# Patient Record
Sex: Male | Born: 1955 | Race: White | Hispanic: No | Marital: Married | Smoking: Never smoker
Health system: Southern US, Community
[De-identification: ages and names within clinical notes are randomized; demographics above are authoritative.]

## PROBLEM LIST (undated history)

## (undated) DIAGNOSIS — M1990 Unspecified osteoarthritis, unspecified site: Secondary | ICD-10-CM

## (undated) DIAGNOSIS — M4800 Spinal stenosis, site unspecified: Secondary | ICD-10-CM

## (undated) DIAGNOSIS — I10 Essential (primary) hypertension: Secondary | ICD-10-CM

## (undated) DIAGNOSIS — E669 Obesity, unspecified: Secondary | ICD-10-CM

## (undated) DIAGNOSIS — K429 Umbilical hernia without obstruction or gangrene: Secondary | ICD-10-CM

## (undated) DIAGNOSIS — G4730 Sleep apnea, unspecified: Secondary | ICD-10-CM

## (undated) HISTORY — PX: BACK SURGERY: SHX140

## (undated) HISTORY — PX: HERNIA REPAIR: SHX51

## (undated) HISTORY — PX: VASECTOMY: SHX75

---

## 1998-09-13 ENCOUNTER — Ambulatory Visit (HOSPITAL_COMMUNITY): Admission: RE | Admit: 1998-09-13 | Discharge: 1998-09-13 | Payer: Self-pay | Admitting: Family Medicine

## 1998-09-13 ENCOUNTER — Encounter: Payer: Self-pay | Admitting: Family Medicine

## 1998-09-29 ENCOUNTER — Ambulatory Visit: Admission: RE | Admit: 1998-09-29 | Discharge: 1998-09-29 | Payer: Self-pay | Admitting: Family Medicine

## 1998-10-31 ENCOUNTER — Ambulatory Visit: Admission: RE | Admit: 1998-10-31 | Discharge: 1998-10-31 | Payer: Self-pay | Admitting: Family Medicine

## 1998-12-05 ENCOUNTER — Encounter: Admission: RE | Admit: 1998-12-05 | Discharge: 1999-03-05 | Payer: Self-pay | Admitting: Family Medicine

## 2007-04-06 ENCOUNTER — Ambulatory Visit (HOSPITAL_COMMUNITY): Admission: RE | Admit: 2007-04-06 | Discharge: 2007-04-07 | Payer: Self-pay | Admitting: Neurological Surgery

## 2013-08-29 ENCOUNTER — Other Ambulatory Visit: Payer: Self-pay | Admitting: Family Medicine

## 2013-08-29 ENCOUNTER — Ambulatory Visit
Admission: RE | Admit: 2013-08-29 | Discharge: 2013-08-29 | Disposition: A | Discharge status: Home / Self Care | Payer: Managed Care, Other (non HMO) | Source: Ambulatory Visit | Attending: Family Medicine | Admitting: Family Medicine

## 2013-08-29 DIAGNOSIS — M25561 Pain in right knee: Secondary | ICD-10-CM

## 2014-03-27 ENCOUNTER — Other Ambulatory Visit: Payer: Self-pay | Admitting: Orthopedic Surgery

## 2014-04-02 ENCOUNTER — Encounter (HOSPITAL_COMMUNITY): Payer: Self-pay | Admitting: Pharmacy Technician

## 2014-04-05 NOTE — Pre-Procedure Instructions (Signed)
Anice PaganiniDavid A Seehafer  04/05/2014   Your procedure is scheduled on:  Monday, April 27th  Report to Admitting at 0530 AM.  Call this number if you have problems the morning of surgery: [phone]   Remember:   Do not eat food or drink liquids after midnight.   Take these medicines the morning of surgery with A SIP OF WATER: pain medication if needed   Do not wear jewelry.  Do not wear lotions, powders, or perfumes. You may wear deodorant.  Do not shave 48 hours prior to surgery. Men may shave face and neck.  Do not bring valuables to the hospital.  [facility] is not responsible for any belongings or valuables.               Contacts, dentures or bridgework may not be worn into surgery.  Leave suitcase in the car. After surgery it may be brought to your room.  For patients admitted to the hospital, discharge time is determined by your  treatment team.  Please read over the following fact sheets that you were given: Pain Booklet, Coughing and Deep Breathing, Blood Transfusion Information, MRSA Information and Surgical Site Infection Prevention [facility] - Preparing for Surgery  Before surgery, you can play an important role.  Because skin is not sterile, your skin needs to be as free of germs as possible.  You can reduce the number of germs on you skin by washing with CHG (chlorahexidine gluconate) soap before surgery.  CHG is an antiseptic cleaner which kills germs and bonds with the skin to continue killing germs even after washing.  Please DO NOT use if you have an allergy to CHG or antibacterial soaps.  If your skin becomes reddened/irritated stop using the CHG and inform your nurse when you arrive at Short Stay.  Do not shave (including legs and underarms) for at least 48 hours prior to the first CHG shower.  You may shave your face.  Please follow these instructions carefully:   1.  Shower with CHG Soap the night before surgery and the morning of Surgery.  2.  If you choose to wash your  hair, wash your hair first as usual with your normal shampoo.  3.  After you shampoo, rinse your hair and body thoroughly to remove the shampoo.  4.  Use CHG as you would any other liquid soap.  You can apply CHG directly to the skin and wash gently with scrungie or a clean washcloth.  5.  Apply the CHG Soap to your body ONLY FROM THE NECK DOWN.  Do not use on open wounds or open sores.  Avoid contact with your eyes, ears, mouth and genitals (private parts).  Wash genitals (private parts) with your normal soap.  6.  Wash thoroughly, paying special attention to the area where your surgery will be performed.  7.  Thoroughly rinse your body with warm water from the neck down.  8.  DO NOT shower/wash with your normal soap after using and rinsing off the CHG Soap.  9.  Pat yourself dry with a clean towel.            10.  Wear clean pajamas.            11.  Place clean sheets on your bed the night of your first shower and do not sleep with pets.  Day of Surgery  Do not apply any lotions/deoderants the morning of surgery.  Please wear clean clothes to the hospital/surgery  center.

## 2014-04-06 ENCOUNTER — Encounter (HOSPITAL_COMMUNITY)
Admission: RE | Admit: 2014-04-06 | Discharge: 2014-04-06 | Disposition: A | Discharge status: Home / Self Care | Payer: BC Managed Care – PPO | Source: Ambulatory Visit | Attending: Orthopedic Surgery | Admitting: Orthopedic Surgery

## 2014-04-06 ENCOUNTER — Ambulatory Visit (HOSPITAL_COMMUNITY)
Admission: RE | Admit: 2014-04-06 | Discharge: 2014-04-06 | Disposition: A | Discharge status: Home / Self Care | Payer: BC Managed Care – PPO | Source: Ambulatory Visit | Attending: Orthopedic Surgery | Admitting: Orthopedic Surgery

## 2014-04-06 ENCOUNTER — Encounter (HOSPITAL_COMMUNITY): Payer: Self-pay

## 2014-04-06 DIAGNOSIS — Z01818 Encounter for other preprocedural examination: Secondary | ICD-10-CM | POA: Insufficient documentation

## 2014-04-06 DIAGNOSIS — Z01812 Encounter for preprocedural laboratory examination: Secondary | ICD-10-CM | POA: Insufficient documentation

## 2014-04-06 DIAGNOSIS — Z01810 Encounter for preprocedural cardiovascular examination: Secondary | ICD-10-CM | POA: Insufficient documentation

## 2014-04-06 HISTORY — DX: Essential (primary) hypertension: I10

## 2014-04-06 HISTORY — DX: Sleep apnea, unspecified: G47.30

## 2014-04-06 HISTORY — DX: Unspecified osteoarthritis, unspecified site: M19.90

## 2014-04-06 HISTORY — DX: Obesity, unspecified: E66.9

## 2014-04-06 LAB — CBC WITH DIFFERENTIAL/PLATELET
Basophils Absolute: 0 10*3/uL (ref 0.0–0.1)
Basophils Relative: 1 % (ref 0–1)
Eosinophils Absolute: 0.4 10*3/uL (ref 0.0–0.7)
Eosinophils Relative: 4 % (ref 0–5)
HCT: 44.4 % (ref 39.0–52.0)
HEMOGLOBIN: 15.9 g/dL (ref 13.0–17.0)
LYMPHS ABS: 2.1 10*3/uL (ref 0.7–4.0)
Lymphocytes Relative: 25 % (ref 12–46)
MCH: 31.9 pg (ref 26.0–34.0)
MCHC: 35.8 g/dL (ref 30.0–36.0)
MCV: 89.2 fL (ref 78.0–100.0)
MONOS PCT: 8 % (ref 3–12)
Monocytes Absolute: 0.6 10*3/uL (ref 0.1–1.0)
Neutro Abs: 5.1 10*3/uL (ref 1.7–7.7)
Neutrophils Relative %: 62 % (ref 43–77)
Platelets: 263 10*3/uL (ref 150–400)
RBC: 4.98 MIL/uL (ref 4.22–5.81)
RDW: 13.6 % (ref 11.5–15.5)
WBC: 8.2 10*3/uL (ref 4.0–10.5)

## 2014-04-06 LAB — URINALYSIS, ROUTINE W REFLEX MICROSCOPIC
Bilirubin Urine: NEGATIVE
Glucose, UA: NEGATIVE mg/dL
Hgb urine dipstick: NEGATIVE
Ketones, ur: NEGATIVE mg/dL
LEUKOCYTES UA: NEGATIVE
Nitrite: NEGATIVE
Protein, ur: NEGATIVE mg/dL
SPECIFIC GRAVITY, URINE: 1.019 (ref 1.005–1.030)
UROBILINOGEN UA: 0.2 mg/dL (ref 0.0–1.0)
pH: 5 (ref 5.0–8.0)

## 2014-04-06 LAB — BASIC METABOLIC PANEL
BUN: 22 mg/dL (ref 6–23)
CALCIUM: 9.9 mg/dL (ref 8.4–10.5)
CO2: 24 meq/L (ref 19–32)
Chloride: 102 mEq/L (ref 96–112)
Creatinine, Ser: 1.1 mg/dL (ref 0.50–1.35)
GFR calc Af Amer: 85 mL/min — ABNORMAL LOW (ref >90)
GFR, EST NON AFRICAN AMERICAN: 73 mL/min — AB (ref >90)
GLUCOSE: 106 mg/dL — AB (ref 70–99)
POTASSIUM: 4.7 meq/L (ref 3.7–5.3)
Sodium: 140 mEq/L (ref 137–147)

## 2014-04-06 LAB — PREPARE RBC (CROSSMATCH)

## 2014-04-06 LAB — APTT: APTT: 28 s (ref 24–37)

## 2014-04-06 LAB — ABO/RH: ABO/RH(D): A POS

## 2014-04-06 LAB — PROTIME-INR
INR: 0.96 (ref 0.00–1.49)
PROTHROMBIN TIME: 12.6 s (ref 11.6–15.2)

## 2014-04-06 LAB — SURGICAL PCR SCREEN
MRSA, PCR: NEGATIVE
Staphylococcus aureus: NEGATIVE

## 2014-04-12 NOTE — H&P (Signed)
TOTAL KNEE ADMISSION H&P  Patient is being admitted for bilaterally total knee arthroplasty.  Subjective:  Chief Complaint:bilaterally knee pain.  HPI: Derrick PaganiniDavid A Pawelski, 58 y.o. male, has a history of pain and functional disability in the bilaterally knee due to arthritis and has failed non-surgical conservative treatments for greater than 12 weeks to includeNSAID's and/or analgesics, corticosteriod injections, flexibility and strengthening excercises and activity modification.  Onset of symptoms was gradual, starting 10 years ago with gradually worsening course since that time. The patient noted no past surgery on the bilaterally knee(s).  Patient currently rates pain in the bilaterally knee(s) at 10 out of 10 with activity. Patient has night pain, worsening of pain with activity and weight bearing, pain that interferes with activities of daily living and pain with passive range of motion.  Patient has evidence of joint subluxation and joint space narrowing by imaging studies.  There is no active infection.  There are no active problems to display for this patient.  Past Medical History  Diagnosis Date  . Hypertension   . Arthritis   . Sleep apnea     cpap       last study 99  . Obese     Past Surgical History  Procedure Laterality Date  . Back surgery    . Hernia repair      No prescriptions prior to admission   No Known Allergies  History  Substance Use Topics  . Smoking status: Never Smoker   . Smokeless tobacco: Not on file  . Alcohol Use: Yes     Comment: occ    No family history on file.   Review of Systems  Constitutional: Negative.   HENT: Negative.   Eyes:       Glasses  Respiratory: Negative.   Cardiovascular: Negative.   Gastrointestinal: Negative.   Genitourinary: Negative.   Musculoskeletal: Positive for joint pain.  Skin: Negative.   Neurological: Negative.   Psychiatric/Behavioral: Negative.     Objective:  Physical Exam  Constitutional: He is  oriented to person, place, and time. He appears well-developed and well-nourished.  HENT:  Head: Normocephalic and atraumatic.  Eyes: Pupils are equal, round, and reactive to light.  Neck: Normal range of motion. Neck supple.  Cardiovascular: Intact distal pulses.   Respiratory: Effort normal.  Musculoskeletal: He exhibits tenderness.  The patient has bilateral 5 flexion contractures, flexes to 120 with pain and there is crepitus as you taken through range of motion.  He is quite tender along the medial joint lines trace effusion to the left knee, no effusion to the right knee he has obvious varus deformities when he is walking.  Neurological: He is alert and oriented to person, place, and time.  Skin: Skin is warm and dry.  Psychiatric: He has a normal mood and affect. His behavior is normal. Judgment and thought content normal.    Vital signs in last 24 hours:    Labs:   There is no height or weight on file to calculate BMI.   Imaging Review Radiographs:  X-rays were ordered, performed, and interpreted by me today included; standing AP and Rosenberg x-rays taken today show bone-on-bone arthritis to the medial compartment with subchondral cysts peripheral osteophytes and most importantly lateral subluxation of the tibias beneath the femurs of about 5 mm to each side.  Assessment/Plan:  End stage arthritis, bilaterally knee   The patient history, physical examination, clinical judgment of the provider and imaging studies are consistent with end stage degenerative joint  disease of the bilaterally knee(s) and total knee arthroplasty is deemed medically necessary. The treatment options including medical management, injection therapy arthroscopy and arthroplasty were discussed at length. The risks and benefits of total knee arthroplasty were presented and reviewed. The risks due to aseptic loosening, infection, stiffness, patella tracking problems, thromboembolic complications and other  imponderables were discussed. The patient acknowledged the explanation, agreed to proceed with the plan and consent was signed. Patient is being admitted for inpatient treatment for surgery, pain control, PT, OT, prophylactic antibiotics, VTE prophylaxis, progressive ambulation and ADL's and discharge planning. The patient is planning to be discharged to skilled nursing facility

## 2014-04-15 MED ORDER — DEXTROSE 5 % IV SOLN
3.0000 g | INTRAVENOUS | Status: AC
  Administered 2014-04-16: 3 g via INTRAVENOUS
  Filled 2014-04-15: qty 3000

## 2014-04-16 ENCOUNTER — Encounter (HOSPITAL_COMMUNITY): Payer: Self-pay

## 2014-04-16 ENCOUNTER — Encounter (HOSPITAL_COMMUNITY)
Admission: RE | Disposition: A | Discharge status: Skilled Nursing Facility | Payer: Self-pay | Source: Ambulatory Visit | Attending: Orthopedic Surgery

## 2014-04-16 ENCOUNTER — Encounter (HOSPITAL_COMMUNITY): Payer: BC Managed Care – PPO | Admitting: Anesthesiology

## 2014-04-16 ENCOUNTER — Inpatient Hospital Stay (HOSPITAL_COMMUNITY): Payer: BC Managed Care – PPO | Admitting: Anesthesiology

## 2014-04-16 ENCOUNTER — Inpatient Hospital Stay (HOSPITAL_COMMUNITY)
Admission: RE | Admit: 2014-04-16 | Discharge: 2014-04-23 | DRG: 462 | Disposition: A | Discharge status: Skilled Nursing Facility | Payer: BC Managed Care – PPO | Source: Ambulatory Visit | Attending: Orthopedic Surgery | Admitting: Orthopedic Surgery

## 2014-04-16 DIAGNOSIS — G4730 Sleep apnea, unspecified: Secondary | ICD-10-CM | POA: Diagnosis present

## 2014-04-16 DIAGNOSIS — I10 Essential (primary) hypertension: Secondary | ICD-10-CM | POA: Diagnosis present

## 2014-04-16 DIAGNOSIS — Z96653 Presence of artificial knee joint, bilateral: Secondary | ICD-10-CM

## 2014-04-16 DIAGNOSIS — E669 Obesity, unspecified: Secondary | ICD-10-CM | POA: Diagnosis present

## 2014-04-16 DIAGNOSIS — M1710 Unilateral primary osteoarthritis, unspecified knee: Principal | ICD-10-CM | POA: Diagnosis present

## 2014-04-16 DIAGNOSIS — Z6841 Body Mass Index (BMI) 40.0 and over, adult: Secondary | ICD-10-CM

## 2014-04-16 DIAGNOSIS — M170 Bilateral primary osteoarthritis of knee: Secondary | ICD-10-CM

## 2014-04-16 HISTORY — PX: TOTAL KNEE ARTHROPLASTY: SHX125

## 2014-04-16 SURGERY — ARTHROPLASTY, KNEE, BILATERAL, TOTAL
Anesthesia: General | Site: Knee | Laterality: Bilateral

## 2014-04-16 MED ORDER — GLYCOPYRROLATE 0.2 MG/ML IJ SOLN
INTRAMUSCULAR | Status: AC
  Filled 2014-04-16: qty 1

## 2014-04-16 MED ORDER — DIPHENHYDRAMINE HCL 12.5 MG/5ML PO ELIX: 12.5000 mg | ORAL_SOLUTION | ORAL | Status: DC | PRN

## 2014-04-16 MED ORDER — METOCLOPRAMIDE HCL 10 MG PO TABS: 5.0000 mg | ORAL_TABLET | Freq: Three times a day (TID) | ORAL | Status: DC | PRN

## 2014-04-16 MED ORDER — ONDANSETRON HCL 4 MG/2ML IJ SOLN
4.0000 mg | Freq: Four times a day (QID) | INTRAMUSCULAR | Status: DC | PRN
  Administered 2014-04-17 (×2): 4 mg via INTRAVENOUS
  Filled 2014-04-16 (×3): qty 2

## 2014-04-16 MED ORDER — GLYCOPYRROLATE 0.2 MG/ML IJ SOLN
INTRAMUSCULAR | Status: DC | PRN
Start: 2014-04-16 — End: 2014-04-16
  Administered 2014-04-16: .8 mg via INTRAVENOUS
  Administered 2014-04-16: 0.2 mg via INTRAVENOUS

## 2014-04-16 MED ORDER — ASPIRIN EC 325 MG PO TBEC
325.0000 mg | DELAYED_RELEASE_TABLET | Freq: Every day | ORAL | Status: DC
  Administered 2014-04-17 – 2014-04-23 (×7): 325 mg via ORAL
  Filled 2014-04-16 (×8): qty 1

## 2014-04-16 MED ORDER — NEOSTIGMINE METHYLSULFATE 1 MG/ML IJ SOLN
INTRAMUSCULAR | Status: AC
  Filled 2014-04-16: qty 20

## 2014-04-16 MED ORDER — METOCLOPRAMIDE HCL 5 MG/ML IJ SOLN
5.0000 mg | Freq: Three times a day (TID) | INTRAMUSCULAR | Status: DC | PRN
  Administered 2014-04-17: 10 mg via INTRAVENOUS
  Filled 2014-04-16: qty 2

## 2014-04-16 MED ORDER — OXYCODONE HCL 5 MG PO TABS
5.0000 mg | ORAL_TABLET | ORAL | Status: DC | PRN
  Administered 2014-04-16 – 2014-04-23 (×43): 10 mg via ORAL
  Filled 2014-04-16 (×43): qty 2

## 2014-04-16 MED ORDER — ACETAMINOPHEN 325 MG PO TABS: 325.0000 mg | ORAL_TABLET | ORAL | Status: DC | PRN

## 2014-04-16 MED ORDER — MIDAZOLAM HCL 5 MG/5ML IJ SOLN
INTRAMUSCULAR | Status: DC | PRN
  Administered 2014-04-16: 2 mg via INTRAVENOUS

## 2014-04-16 MED ORDER — ROCURONIUM BROMIDE 50 MG/5ML IV SOLN
INTRAVENOUS | Status: AC
  Filled 2014-04-16: qty 1

## 2014-04-16 MED ORDER — LIDOCAINE HCL (CARDIAC) 20 MG/ML IV SOLN
INTRAVENOUS | Status: AC
  Filled 2014-04-16: qty 5

## 2014-04-16 MED ORDER — PROPOFOL 10 MG/ML IV BOLUS
INTRAVENOUS | Status: AC
  Filled 2014-04-16: qty 20

## 2014-04-16 MED ORDER — ONDANSETRON HCL 4 MG/2ML IJ SOLN
INTRAMUSCULAR | Status: DC | PRN
  Administered 2014-04-16: 4 mg via INTRAVENOUS

## 2014-04-16 MED ORDER — OXYCODONE HCL 5 MG PO TABS: 5.0000 mg | ORAL_TABLET | Freq: Once | ORAL | Status: DC | PRN

## 2014-04-16 MED ORDER — PHENYLEPHRINE 40 MCG/ML (10ML) SYRINGE FOR IV PUSH (FOR BLOOD PRESSURE SUPPORT)
PREFILLED_SYRINGE | INTRAVENOUS | Status: AC
  Filled 2014-04-16: qty 10

## 2014-04-16 MED ORDER — LACTATED RINGERS IV SOLN
INTRAVENOUS | Status: DC | PRN
  Administered 2014-04-16 (×3): via INTRAVENOUS

## 2014-04-16 MED ORDER — ROPIVACAINE HCL 5 MG/ML IJ SOLN
INTRAMUSCULAR | Status: DC | PRN
  Administered 2014-04-16 (×2): 20 mL via PERINEURAL

## 2014-04-16 MED ORDER — HYDROMORPHONE HCL PF 1 MG/ML IJ SOLN
0.2500 mg | INTRAMUSCULAR | Status: DC | PRN
  Administered 2014-04-16 (×4): 0.5 mg via INTRAVENOUS

## 2014-04-16 MED ORDER — TRANEXAMIC ACID 100 MG/ML IV SOLN
1000.0000 mg | INTRAVENOUS | Status: AC
  Administered 2014-04-16: 1000 mg via INTRAVENOUS
  Filled 2014-04-16: qty 10

## 2014-04-16 MED ORDER — DOCUSATE SODIUM 100 MG PO CAPS
100.0000 mg | ORAL_CAPSULE | Freq: Two times a day (BID) | ORAL | Status: DC
  Administered 2014-04-17 – 2014-04-23 (×12): 100 mg via ORAL
  Filled 2014-04-16 (×15): qty 1

## 2014-04-16 MED ORDER — FENTANYL CITRATE 0.05 MG/ML IJ SOLN
INTRAMUSCULAR | Status: DC | PRN
  Administered 2014-04-16 (×2): 50 ug via INTRAVENOUS
  Administered 2014-04-16: 100 ug via INTRAVENOUS
  Administered 2014-04-16: 50 ug via INTRAVENOUS
  Administered 2014-04-16: 100 ug via INTRAVENOUS

## 2014-04-16 MED ORDER — PROMETHAZINE HCL 25 MG/ML IJ SOLN: 6.2500 mg | INTRAMUSCULAR | Status: DC | PRN

## 2014-04-16 MED ORDER — ASPIRIN EC 325 MG PO TBEC: 325.0000 mg | DELAYED_RELEASE_TABLET | Freq: Two times a day (BID) | ORAL | Status: DC

## 2014-04-16 MED ORDER — ALUM & MAG HYDROXIDE-SIMETH 200-200-20 MG/5ML PO SUSP
30.0000 mL | ORAL | Status: DC | PRN
  Administered 2014-04-17 – 2014-04-22 (×15): 30 mL via ORAL
  Filled 2014-04-16 (×15): qty 30

## 2014-04-16 MED ORDER — EPHEDRINE SULFATE 50 MG/ML IJ SOLN
INTRAMUSCULAR | Status: AC
  Filled 2014-04-16: qty 1

## 2014-04-16 MED ORDER — METOCLOPRAMIDE HCL 5 MG/ML IJ SOLN
INTRAMUSCULAR | Status: DC | PRN
  Administered 2014-04-16: 10 mg via INTRAVENOUS

## 2014-04-16 MED ORDER — FENTANYL CITRATE 0.05 MG/ML IJ SOLN
INTRAMUSCULAR | Status: AC
  Filled 2014-04-16: qty 5

## 2014-04-16 MED ORDER — ONDANSETRON HCL 4 MG PO TABS: 4.0000 mg | ORAL_TABLET | Freq: Four times a day (QID) | ORAL | Status: DC | PRN

## 2014-04-16 MED ORDER — LISINOPRIL 20 MG PO TABS
20.0000 mg | ORAL_TABLET | Freq: Every day | ORAL | Status: DC
  Administered 2014-04-16 – 2014-04-22 (×6): 20 mg via ORAL
  Filled 2014-04-16 (×9): qty 1

## 2014-04-16 MED ORDER — EPHEDRINE SULFATE 50 MG/ML IJ SOLN
INTRAMUSCULAR | Status: DC | PRN
  Administered 2014-04-16: 10 mg via INTRAVENOUS

## 2014-04-16 MED ORDER — ONDANSETRON HCL 4 MG/2ML IJ SOLN
INTRAMUSCULAR | Status: AC
  Filled 2014-04-16: qty 2

## 2014-04-16 MED ORDER — MIDAZOLAM HCL 2 MG/2ML IJ SOLN
INTRAMUSCULAR | Status: AC
  Filled 2014-04-16: qty 2

## 2014-04-16 MED ORDER — SIMVASTATIN 20 MG PO TABS
20.0000 mg | ORAL_TABLET | ORAL | Status: DC
  Administered 2014-04-18 – 2014-04-20 (×3): 20 mg via ORAL
  Filled 2014-04-16 (×4): qty 1

## 2014-04-16 MED ORDER — BISACODYL 5 MG PO TBEC: 5.0000 mg | DELAYED_RELEASE_TABLET | Freq: Every day | ORAL | Status: DC | PRN

## 2014-04-16 MED ORDER — SENNOSIDES-DOCUSATE SODIUM 8.6-50 MG PO TABS: 1.0000 | ORAL_TABLET | Freq: Every evening | ORAL | Status: DC | PRN

## 2014-04-16 MED ORDER — CHLORHEXIDINE GLUCONATE 4 % EX LIQD
60.0000 mL | Freq: Once | CUTANEOUS | Status: DC
  Filled 2014-04-16: qty 60

## 2014-04-16 MED ORDER — OXYCODONE HCL 5 MG/5ML PO SOLN: 5.0000 mg | Freq: Once | ORAL | Status: DC | PRN

## 2014-04-16 MED ORDER — GLYCOPYRROLATE 0.2 MG/ML IJ SOLN
INTRAMUSCULAR | Status: AC
  Filled 2014-04-16: qty 5

## 2014-04-16 MED ORDER — SODIUM CHLORIDE 0.9 % IJ SOLN
INTRAMUSCULAR | Status: AC
  Filled 2014-04-16: qty 10

## 2014-04-16 MED ORDER — ACETAMINOPHEN 160 MG/5ML PO SOLN: 325.0000 mg | ORAL | Status: DC | PRN

## 2014-04-16 MED ORDER — HYDROMORPHONE HCL PF 1 MG/ML IJ SOLN
1.0000 mg | INTRAMUSCULAR | Status: DC | PRN
  Administered 2014-04-16 – 2014-04-18 (×11): 1 mg via INTRAVENOUS
  Filled 2014-04-16 (×13): qty 1

## 2014-04-16 MED ORDER — ACETAMINOPHEN 325 MG PO TABS: 650.0000 mg | ORAL_TABLET | Freq: Four times a day (QID) | ORAL | Status: DC | PRN

## 2014-04-16 MED ORDER — METHOCARBAMOL 500 MG PO TABS
500.0000 mg | ORAL_TABLET | Freq: Four times a day (QID) | ORAL | Status: DC | PRN
  Administered 2014-04-16 – 2014-04-23 (×23): 500 mg via ORAL
  Filled 2014-04-16 (×24): qty 1

## 2014-04-16 MED ORDER — SODIUM CHLORIDE 0.9 % IR SOLN
Status: DC | PRN
  Administered 2014-04-16: 1000 mL

## 2014-04-16 MED ORDER — BUPIVACAINE LIPOSOME 1.3 % IJ SUSP
20.0000 mL | INTRAMUSCULAR | Status: DC
Start: 2014-04-16 — End: 2014-04-16
  Filled 2014-04-16: qty 20

## 2014-04-16 MED ORDER — KETOROLAC TROMETHAMINE 30 MG/ML IJ SOLN: 15.0000 mg | Freq: Once | INTRAMUSCULAR | Status: DC | PRN

## 2014-04-16 MED ORDER — BUPIVACAINE LIPOSOME 1.3 % IJ SUSP
20.0000 mL | INTRAMUSCULAR | Status: DC
  Filled 2014-04-16: qty 20

## 2014-04-16 MED ORDER — MENTHOL 3 MG MT LOZG: 1.0000 | LOZENGE | OROMUCOSAL | Status: DC | PRN

## 2014-04-16 MED ORDER — BUPIVACAINE LIPOSOME 1.3 % IJ SUSP
INTRAMUSCULAR | Status: DC | PRN
  Administered 2014-04-16 (×2): 20 mL

## 2014-04-16 MED ORDER — METHOCARBAMOL 500 MG PO TABS
ORAL_TABLET | ORAL | Status: AC
  Administered 2014-04-16: 500 mg via ORAL
  Filled 2014-04-16: qty 1

## 2014-04-16 MED ORDER — ROCURONIUM BROMIDE 100 MG/10ML IV SOLN
INTRAVENOUS | Status: DC | PRN
  Administered 2014-04-16: 50 mg via INTRAVENOUS
  Administered 2014-04-16: 20 mg via INTRAVENOUS

## 2014-04-16 MED ORDER — NEOSTIGMINE METHYLSULFATE 1 MG/ML IJ SOLN
INTRAMUSCULAR | Status: DC | PRN
  Administered 2014-04-16: 4 mg via INTRAVENOUS

## 2014-04-16 MED ORDER — PHENYLEPHRINE HCL 10 MG/ML IJ SOLN
INTRAMUSCULAR | Status: DC | PRN
  Administered 2014-04-16: 120 ug via INTRAVENOUS
  Administered 2014-04-16 (×4): 80 ug via INTRAVENOUS

## 2014-04-16 MED ORDER — KCL IN DEXTROSE-NACL 20-5-0.45 MEQ/L-%-% IV SOLN
INTRAVENOUS | Status: DC
  Administered 2014-04-17 (×4): via INTRAVENOUS
  Filled 2014-04-16 (×22): qty 1000

## 2014-04-16 MED ORDER — METHOCARBAMOL 100 MG/ML IJ SOLN
500.0000 mg | Freq: Four times a day (QID) | INTRAVENOUS | Status: DC | PRN
  Filled 2014-04-16: qty 5

## 2014-04-16 MED ORDER — SODIUM CHLORIDE 0.9 % IR SOLN
Status: DC | PRN
  Administered 2014-04-16: 3000 mL

## 2014-04-16 MED ORDER — STERILE WATER FOR INJECTION IJ SOLN
INTRAMUSCULAR | Status: AC
  Filled 2014-04-16: qty 10

## 2014-04-16 MED ORDER — MAGNESIUM CITRATE PO SOLN: 1.0000 | Freq: Once | ORAL | Status: AC | PRN

## 2014-04-16 MED ORDER — OXYCODONE-ACETAMINOPHEN 5-325 MG PO TABS: 1.0000 | ORAL_TABLET | ORAL | Status: DC | PRN

## 2014-04-16 MED ORDER — PHENOL 1.4 % MT LIQD: 1.0000 | OROMUCOSAL | Status: DC | PRN

## 2014-04-16 MED ORDER — LIDOCAINE HCL (CARDIAC) 20 MG/ML IV SOLN
INTRAVENOUS | Status: DC | PRN
  Administered 2014-04-16: 80 mg via INTRAVENOUS

## 2014-04-16 MED ORDER — PROPOFOL 10 MG/ML IV BOLUS
INTRAVENOUS | Status: DC | PRN
  Administered 2014-04-16: 200 mg via INTRAVENOUS

## 2014-04-16 MED ORDER — CYCLOBENZAPRINE HCL 10 MG PO TABS: 10.0000 mg | ORAL_TABLET | Freq: Two times a day (BID) | ORAL | Status: AC

## 2014-04-16 MED ORDER — METOCLOPRAMIDE HCL 5 MG/ML IJ SOLN
INTRAMUSCULAR | Status: AC
  Filled 2014-04-16: qty 2

## 2014-04-16 MED ORDER — HYDROMORPHONE HCL PF 1 MG/ML IJ SOLN
INTRAMUSCULAR | Status: AC
  Filled 2014-04-16: qty 1

## 2014-04-16 MED ORDER — TRANEXAMIC ACID 100 MG/ML IV SOLN: 1000.0000 mg | INTRAVENOUS | Status: DC

## 2014-04-16 MED ORDER — BUPIVACAINE LIPOSOME 1.3 % IJ SUSP: 20.0000 mL | Freq: Once | INTRAMUSCULAR | Status: DC

## 2014-04-16 MED ORDER — ACETAMINOPHEN 650 MG RE SUPP: 650.0000 mg | Freq: Four times a day (QID) | RECTAL | Status: DC | PRN

## 2014-04-16 SURGICAL SUPPLY — 80 items
BANDAGE ELASTIC 4 VELCRO ST LF (GAUZE/BANDAGES/DRESSINGS) ×6 IMPLANT
BANDAGE ELASTIC 6 VELCRO ST LF (GAUZE/BANDAGES/DRESSINGS) ×4 IMPLANT
BANDAGE ESMARK 6X9 LF (GAUZE/BANDAGES/DRESSINGS) ×1 IMPLANT
BLADE 10 SAFETY STRL DISP (BLADE) ×1 IMPLANT
BLADE SAG 18X100X1.27 (BLADE) ×3 IMPLANT
BLADE SAW SGTL 13X75X1.27 (BLADE) ×3 IMPLANT
BLADE SURG 10 STRL SS (BLADE) ×4 IMPLANT
BLADE SURG ROTATE 9660 (MISCELLANEOUS) IMPLANT
BNDG CMPR 9X6 STRL LF SNTH (GAUZE/BANDAGES/DRESSINGS) ×1
BNDG COHESIVE 4X5 TAN NS LF (GAUZE/BANDAGES/DRESSINGS) ×1 IMPLANT
BNDG COHESIVE 6X5 TAN STRL LF (GAUZE/BANDAGES/DRESSINGS) ×2 IMPLANT
BNDG ESMARK 6X9 LF (GAUZE/BANDAGES/DRESSINGS) ×3
BOWL SMART MIX CTS (DISPOSABLE) ×6 IMPLANT
CAP KNEE ATTUNE RP ×4 IMPLANT
CEMENT HV SMART SET (Cement) ×12 IMPLANT
COVER BACK TABLE 24X17X13 BIG (DRAPES) IMPLANT
COVER SURGICAL LIGHT HANDLE (MISCELLANEOUS) ×4 IMPLANT
CUFF TOURNIQUET SINGLE 34IN LL (TOURNIQUET CUFF) ×2 IMPLANT
CUFF TOURNIQUET SINGLE 44IN (TOURNIQUET CUFF) IMPLANT
DRAPE EXTREMITY BILATERAL (DRAPE) ×3 IMPLANT
DRAPE INCISE IOBAN 66X45 STRL (DRAPES) ×5 IMPLANT
DRAPE ORTHO SPLIT 77X108 STRL (DRAPES) ×3
DRAPE PROXIMA HALF (DRAPES) ×4 IMPLANT
DRAPE SURG ORHT 6 SPLT 77X108 (DRAPES) IMPLANT
DRAPE U-SHAPE 47X51 STRL (DRAPES) ×8 IMPLANT
DURAPREP 26ML APPLICATOR (WOUND CARE) ×6 IMPLANT
ELECT REM PT RETURN 9FT ADLT (ELECTROSURGICAL) ×3
ELECTRODE REM PT RTRN 9FT ADLT (ELECTROSURGICAL) ×1 IMPLANT
EVACUATOR 1/8 PVC DRAIN (DRAIN) ×4 IMPLANT
GAUZE XEROFORM 1X8 LF (GAUZE/BANDAGES/DRESSINGS) ×4 IMPLANT
GLOVE BIO SURGEON STRL SZ7.5 (GLOVE) ×3 IMPLANT
GLOVE BIO SURGEON STRL SZ8.5 (GLOVE) ×3 IMPLANT
GLOVE BIOGEL PI IND STRL 6.5 (GLOVE) IMPLANT
GLOVE BIOGEL PI IND STRL 7.0 (GLOVE) IMPLANT
GLOVE BIOGEL PI IND STRL 8 (GLOVE) ×2 IMPLANT
GLOVE BIOGEL PI IND STRL 9 (GLOVE) ×1 IMPLANT
GLOVE BIOGEL PI INDICATOR 6.5 (GLOVE) ×2
GLOVE BIOGEL PI INDICATOR 7.0 (GLOVE) ×2
GLOVE BIOGEL PI INDICATOR 8 (GLOVE) ×2
GLOVE BIOGEL PI INDICATOR 9 (GLOVE) ×2
GLOVE SURG SS PI 6.5 STRL IVOR (GLOVE) ×4 IMPLANT
GLOVE SURG SS PI 7.0 STRL IVOR (GLOVE) ×4 IMPLANT
GOWN STRL REUS W/ TWL LRG LVL3 (GOWN DISPOSABLE) ×3 IMPLANT
GOWN STRL REUS W/ TWL XL LVL3 (GOWN DISPOSABLE) ×1 IMPLANT
GOWN STRL REUS W/TWL 2XL LVL3 (GOWN DISPOSABLE) ×2 IMPLANT
GOWN STRL REUS W/TWL LRG LVL3 (GOWN DISPOSABLE) ×6
GOWN STRL REUS W/TWL XL LVL3 (GOWN DISPOSABLE) ×6
HANDPIECE INTERPULSE COAX TIP (DISPOSABLE) ×3
HOOD PEEL AWAY FACE SHEILD DIS (HOOD) ×9 IMPLANT
KIT BASIN OR (CUSTOM PROCEDURE TRAY) ×3 IMPLANT
KIT ROOM TURNOVER OR (KITS) ×3 IMPLANT
MANIFOLD NEPTUNE II (INSTRUMENTS) ×3 IMPLANT
NDL SPNL 18GX3.5 QUINCKE PK (NEEDLE) IMPLANT
NEEDLE SPNL 18GX3.5 QUINCKE PK (NEEDLE) ×6 IMPLANT
NS IRRIG 1000ML POUR BTL (IV SOLUTION) ×3 IMPLANT
PACK TOTAL JOINT (CUSTOM PROCEDURE TRAY) ×3 IMPLANT
PAD ABD 8X10 STRL (GAUZE/BANDAGES/DRESSINGS) ×2 IMPLANT
PAD ARMBOARD 7.5X6 YLW CONV (MISCELLANEOUS) ×6 IMPLANT
PAD CAST 4YDX4 CTTN HI CHSV (CAST SUPPLIES) ×2 IMPLANT
PADDING CAST ABS 6INX4YD NS (CAST SUPPLIES) ×2
PADDING CAST ABS COTTON 6X4 NS (CAST SUPPLIES) IMPLANT
PADDING CAST COTTON 4X4 STRL (CAST SUPPLIES) ×6
PADDING CAST COTTON 6X4 STRL (CAST SUPPLIES) ×6 IMPLANT
SET HNDPC FAN SPRY TIP SCT (DISPOSABLE) IMPLANT
SPONGE GAUZE 4X4 12PLY (GAUZE/BANDAGES/DRESSINGS) ×3 IMPLANT
STAPLER VISISTAT 35W (STAPLE) ×6 IMPLANT
STOCKINETTE IMPERVIOUS 9X36 MD (GAUZE/BANDAGES/DRESSINGS) ×3 IMPLANT
STOCKINETTE IMPERVIOUS LG (DRAPES) ×2 IMPLANT
SUCTION FRAZIER TIP 10 FR DISP (SUCTIONS) ×3 IMPLANT
SUT VIC AB 0 CT1 27 (SUTURE) ×6
SUT VIC AB 0 CT1 27XBRD ANBCTR (SUTURE) ×2 IMPLANT
SUT VIC AB 1 CTX 36 (SUTURE) ×6
SUT VIC AB 1 CTX36XBRD ANBCTR (SUTURE) ×2 IMPLANT
SUT VIC AB 2-0 CT1 27 (SUTURE) ×6
SUT VIC AB 2-0 CT1 TAPERPNT 27 (SUTURE) ×2 IMPLANT
SYR 50ML LL SCALE MARK (SYRINGE) ×2 IMPLANT
TOWEL OR 17X24 6PK STRL BLUE (TOWEL DISPOSABLE) ×3 IMPLANT
TOWEL OR 17X26 10 PK STRL BLUE (TOWEL DISPOSABLE) ×3 IMPLANT
TRAY FOLEY CATH 14FR (SET/KITS/TRAYS/PACK) ×2 IMPLANT
WATER STERILE IRR 1000ML POUR (IV SOLUTION) ×3 IMPLANT

## 2014-04-16 NOTE — Care Management Note (Signed)
CARE MANAGEMENT NOTE 04/16/2014  Patient:  Derrick [name],Derrick [name]   Account Number:  [id number]  Date Initiated:  04/16/2014  Documentation initiated by:  Ambri Miltner  Subjective/Objective Assessment:   58 yr old male s/p bilateral knee arthroplasties.     Action/Plan:   Case manager spoke briefly with patient. Preoperatively setup with [facility], but plans to go to [facility] for shortterm rehab. Socail worker notified.  PT/OT to eval.   Anticipated DC Date:  04/18/2014   Anticipated DC Plan:  SKILLED NURSING FACILITY  In-house referral  Clinical Social Worker      DC Planning Services  CM consult      Choice offered to / List presented to:             HH agency  [facility]   Status of service:  In process, will continue to follow  

## 2014-04-16 NOTE — Progress Notes (Signed)
Orthopedic Tech Progress Note Patient Details:  Anice PaganiniDavid A Koppel 05-Jun-1956 [id number]  CPM Left Knee CPM Left Knee: On Left Knee Flexion (Degrees): 60 Left Knee Extension (Degrees): 0 Additional Comments: Trapeze bar CPM Right Knee CPM Right Knee: On Right Knee Flexion (Degrees): 60 Right Knee Extension (Degrees): 0   Mickie BailJennifer Carol Cammer 04/16/2014, 12:57 PM

## 2014-04-16 NOTE — Anesthesia Postprocedure Evaluation (Signed)
  Anesthesia Post-op Note  Patient: Derrick [name]  Procedure(s) Performed: Procedure(s) with comments: TOTAL KNEE BILATERAL (Bilateral) - Bilateral nerve blocks; General anesthesia  Patient Location: PACU  Anesthesia Type:GA combined with regional for post-op pain  Level of Consciousness: awake and alert   Airway and Oxygen Therapy: Patient Spontanous Breathing and Patient connected to nasal cannula oxygen  Post-op Pain: moderate  Post-op Assessment: Post-op Vital signs reviewed, Patient's Cardiovascular Status Stable, Respiratory Function Stable, Patent Airway, No signs of Nausea or vomiting and Pain level controlled  Post-op Vital Signs: Reviewed and stable  Last Vitals:  Filed Vitals:   04/16/14 1338  BP:   Pulse:   Temp: 36.9 C  Resp: 11    Complications: No apparent anesthesia complications

## 2014-04-16 NOTE — Progress Notes (Signed)
RT note: RT set cpap at 15cmH2O per home settings. Patient brought their own tubing and mask from home. Patient stated that he will place himself on or call when he is ready to sleep.

## 2014-04-16 NOTE — Transfer of Care (Signed)
Immediate Anesthesia Transfer of Care Note  Patient: Derrick PaganiniDavid A [name]  Procedure(s) Performed: Procedure(s) with comments: TOTAL KNEE BILATERAL (Bilateral) - Bilateral nerve blocks; General anesthesia  Patient Location: PACU  Anesthesia Type:General  Level of Consciousness: awake, alert  and oriented  Airway & Oxygen Therapy: Patient Spontanous Breathing and Patient connected to nasal cannula oxygen  Post-op Assessment: Report given to PACU RN, Post -op Vital signs reviewed and stable and Patient moving all extremities X 4  Post vital signs: Reviewed and stable  Complications: No apparent anesthesia complications

## 2014-04-16 NOTE — Anesthesia Preprocedure Evaluation (Addendum)
Anesthesia Evaluation  Patient identified by MRN, date of birth, ID band Patient awake    Reviewed: Allergy & Precautions, H&P , NPO status , Patient's Chart, lab work & pertinent test results  Airway Mallampati: II      Dental  (+) Teeth Intact, Dental Advidsory Given   Pulmonary sleep apnea and Continuous Positive Airway Pressure Ventilation ,          Cardiovascular hypertension, Rhythm:regular Rate:Normal     Neuro/Psych    GI/Hepatic   Endo/Other    Renal/GU      Musculoskeletal   Abdominal   Peds  Hematology   Anesthesia Other Findings   Reproductive/Obstetrics                          Anesthesia Physical Anesthesia Plan  ASA: III  Anesthesia Plan:    Post-op Pain Management:    Induction:   Airway Management Planned:   Additional Equipment:   Intra-op Plan:   Post-operative Plan:   Informed Consent:   Dental Advisory Given  Plan Discussed with: Anesthesiologist and Surgeon  Anesthesia Plan Comments:        Anesthesia Quick Evaluation

## 2014-04-16 NOTE — Care Management Note (Signed)
CARE MANAGEMENT NOTE 04/16/2014  Patient:  Derrick [name],Derrick [name]   Account Number:  [id number]  Date Initiated:  04/16/2014  Documentation initiated by:  Vance PeperBRADY,Deijah Spikes  Subjective/Objective Assessment:   58 yr old male s/p bilateral knee arthroplasties.     Action/Plan:   Case manager spoke briefly with patient. [facility]. Socail worker notified.  PT/OT to eval.   Anticipated DC Date:  04/18/2014   Anticipated DC Plan:  SKILLED NURSING FACILITY  In-house referral  Clinical Social Worker      DC Planning Services  CM consult      [facility] offered [facility] / [facility]             [facility]   Status of service:  In process, will continue to follow

## 2014-04-16 NOTE — Interval H&P Note (Signed)
History and Physical Interval Note:  04/16/2014 7:14 AM  Derrick [name]  has presented today for surgery, with the diagnosis of OSTEOARTHRITIS BILATERAL KNEES  The various methods of treatment have been discussed with the patient and family. After consideration of risks, benefits and other options for treatment, the patient has consented to  Procedure(s): TOTAL KNEE BILATERAL (Bilateral) as a surgical intervention .  The patient's history has been reviewed, patient examined, no change in status, stable for surgery.  I have reviewed the patient's chart and labs.  Questions were answered to the patient's satisfaction.     Nestor LewandowskyFrank J Kit Brubacher

## 2014-04-16 NOTE — Anesthesia Procedure Notes (Addendum)
Anesthesia Regional Block:  Femoral nerve block  Pre-Anesthetic Checklist: ,, timeout performed, Correct Patient, Correct Site, Correct Laterality, Correct Procedure, Correct Position, site marked, Risks and benefits discussed,  Surgical consent,  Pre-op evaluation,  At surgeon's request and post-op pain management  Laterality: Lower and Right  Prep: chloraprep       Needles:  Injection technique: Single-shot  Needle Type: Echogenic Needle          Additional Needles:  Procedures: ultrasound guided (picture in chart) Femoral nerve block  Nerve Stimulator or Paresthesia:  Response: quad, 0.2 mA,   Additional Responses:   Narrative:  Start time: 04/16/2014 7:43 AM End time: 04/16/2014 7:48 AM Injection made incrementally with aspirations every 5 mL.  Performed by: Personally  Anesthesiologist: Moser  Additional Notes: H+P and labs reviewed, risks and benefits discussed with patient, procedure tolerated well without complications   Anesthesia Regional Block:  Femoral nerve block  Pre-Anesthetic Checklist: ,, timeout performed, Correct Patient, Correct Site, Correct Laterality, Correct Procedure, Correct Position, site marked, Risks and benefits discussed,  Surgical consent,  Pre-op evaluation,  At surgeon's request and post-op pain management  Laterality: Lower and Left  Prep: chloraprep       Needles:  Injection technique: Single-shot  Needle Type: Echogenic Needle          Additional Needles:  Procedures: ultrasound guided (picture in chart) Femoral nerve block  Nerve Stimulator or Paresthesia:  Response: quad, 0.36 mA,   Additional Responses:   Narrative:  Start time: 04/16/2014 7:53 AM End time: 04/16/2014 7:57 AM Injection made incrementally with aspirations every 5 mL.  Performed by: Personally  Anesthesiologist: Maple HudsonMoser  Additional Notes: H+P and labs reviewed, risks and benefits discussed with patient, procedure tolerated well without  complications   Procedure Name: Intubation Date/Time: 04/16/2014 8:09 AM Performed by: Carmela RimaMARTINELLI, Kahlia Lagunes F Pre-anesthesia Checklist: Patient identified, Timeout performed, Emergency Drugs available, Patient being monitored and Suction available Patient Re-evaluated:Patient Re-evaluated prior to inductionOxygen Delivery Method: Circle system utilized Preoxygenation: Pre-oxygenation with 100% oxygen Intubation Type: IV induction Ventilation: Mask ventilation without difficulty Laryngoscope Size: Mac and 4 Grade View: Grade II Tube type: Oral Tube size: 7.5 mm Number of attempts: 2 Placement Confirmation: positive ETCO2,  ETT inserted through vocal cords under direct vision and breath sounds checked- equal and bilateral Secured at: 23 cm Tube secured with: Tape Dental Injury: Teeth and Oropharynx as per pre-operative assessment

## 2014-04-16 NOTE — Progress Notes (Signed)
Pt places self on/off.

## 2014-04-16 NOTE — Op Note (Signed)
PATIENT ID:      Derrick [name]  MRN:     [id number] DOB/AGE:    05-19-1956 / 58 y.o.       OPERATIVE REPORT    DATE OF PROCEDURE:  04/16/2014       PREOPERATIVE DIAGNOSIS:   OSTEOARTHRITIS BILATERAL KNEES      Estimated body mass index is 44.33 kg/(m^2) as calculated from the following:   Height as of 04/06/14: 6' (1.829 m).   Weight as of this encounter: 148.3 kg (326 lb 15.1 oz).                                                        POSTOPERATIVE DIAGNOSIS:   OSTEOARTHRITIS BILATERAL KNEES                                                                      PROCEDURE:  Procedure(s): TOTAL KNEE BILATERAL Using Depuy Attune implants #8 Femur, #9Tibia, R 7mm, L 6mm  RP bearing, 41 Patella     SURGEON: Nestor Lewandowsky    ASSISTANT:   Tomi Likens. [employer]   (Present and scrubbed throughout the case, critical for assistance with exposure, retraction, instrumentation, and closure.)         ANESTHESIA: GET Exparel  DRAINS: foley, 2 medium hemovac in knee   TOURNIQUET TIME:   COMPLICATIONS:  None     SPECIMENS: None   INDICATIONS FOR PROCEDURE: The patient has  OSTEOARTHRITIS BILATERAL KNEES, varus deformities, XR shows bone on bone arthritis. Patient has failed all conservative measures including anti-inflammatory medicines, narcotics, attempts at  exercise and weight loss, cortisone injections and viscosupplementation.  Risks and benefits of surgery have been discussed, questions answered.   DESCRIPTION OF PROCEDURE: The patient identified by armband, received  IV antibiotics, in the holding area at [facility]. Patient taken to the operating room, appropriate anesthetic  monitors were attached, and general endotracheal anesthesia induced with  the patient in supine position, Foley catheter was inserted. Tourniquet  applied high to B thigh. Lateral post and foot positioner  applied to the table, the lower extremity was then prepped and draped  in usual sterile fashion from the  ankle to the tourniquet. Time-out procedure was performed. The limb was wrapped with an Esmarch bandage and the tourniquet inflated to 350 mmHg. We began the operation by making the anterior midline incision starting at handbreadth above the patella going over the patella 1 cm medial to and  4 cm distal to the tibial tubercle. Small bleeders in the skin and the  subcutaneous tissue identified and cauterized. Transverse retinaculum was incised and reflected medially and a medial parapatellar arthrotomy was accomplished. the patella was everted and theprepatellar fat pad resected. The superficial medial collateral  ligament was then elevated from anterior to posterior along the proximal  flare of the tibia and anterior half of the menisci resected. The knee was hyperflexed exposing bone on bone arthritis. Peripheral and notch osteophytes as well as the cruciate ligaments were then resected. We continued to  work our way  around posteriorly along the proximal tibia, and externally  rotated the tibia subluxing it out from underneath the femur. A McHale  retractor was placed through the notch and a lateral Hohmann retractor  placed, and we then drilled through the proximal tibia in line with the  axis of the tibia followed by an intramedullary guide rod and 2-degree  posterior slope cutting guide. The tibial cutting guide was pinned into place  allowing resection of 4 mm of bone medially and about 11 mm of bone  laterally because of her varus deformity. Satisfied with the tibial resection, we then  entered the distal femur 2 mm anterior to the PCL origin with the  intramedullary guide rod and applied the distal femoral cutting guide  set at 11mm, with 5 degrees of valgus. This was pinned along the  epicondylar axis. At this point, the distal femoral cut was accomplished without difficulty. We then sized for a #8R femoral component and pinned the guide in 3 degrees of external rotation.The chamfer cutting  guide was pinned into place. The anterior, posterior, and chamfer cuts were accomplished without difficulty followed by  the Sigma RP box cutting guide and the box cut. We also removed posterior osteophytes from the posterior femoral condyles. At this  time, the knee was brought into full extension. We checked our  extension and flexion gaps and found them symmetric at 11mm.  The patella thickness measured at 28 mm. We set the cutting guide at 9.5 and removed the posterior 9.5 mm  of the patella, sized for a 41 anatomic button and drilled the lollipop. The knee  was then once again hyperflexed exposing the proximal tibia. We sized for a #9 tibial base plate, applied the smokestack and the conical reamer followed by the the Delta fin keel punch. We then hammered into place the Attune trial femoral component, inserted a 7-mm trial bearing, trial patellar button, and took the knee through range of motion from 0-130 degrees. No thumb pressure was required for patellar  tracking. At this point, all trial components were removed, a double batch of DePuy HV cement with 1500 mg of Zinacef was mixed and applied to all bony metallic mating surfaces except for the posterior condyles of the femur itself. In order, we  hammered into place the tibial tray and removed excess cement, the femoral component and removed excess cement, a Attune RP bearing  was inserted, and the knee brought to full extension with compression.  The patellar button was clamped into place, and excess cement  removed. While the cement cured the wound was irrigated out with normal saline solution pulse lavage, and medium Hemovac drains were placed from an anterolateral  approach. Ligament stability and patellar tracking were checked and found to be excellent. The parapatellar arthrotomy was closed with  running #1 Vicryl suture. The subcutaneous tissue with 0 and 2-0 undyed  Vicryl suture, and the skin with skin staples. A dressing of  Xeroform,  4 x 4, dressing sponges, Webril, and Ace wrap applied.   The L limb was wrapped with an Esmarch bandage and the tourniquet inflated to 350 mmHg. We began the operation by making the anterior midline incision starting at handbreadth above the patella going over the patella 1 cm medial to and  4 cm distal to the tibial tubercle. Small bleeders in the skin and the  subcutaneous tissue identified and cauterized. Transverse retinaculum was incised and reflected medially and a medial parapatellar arthrotomy was accomplished. the patella was everted  and theprepatellar fat pad resected. The superficial medial collateral  ligament was then elevated from anterior to posterior along the proximal  flare of the tibia and anterior half of the menisci resected. The knee was hyperflexed exposing bone on bone arthritis. Peripheral and notch osteophytes as well as the cruciate ligaments were then resected. We continued to  work our way around posteriorly along the proximal tibia, and externally  rotated the tibia subluxing it out from underneath the femur. A McHale  retractor was placed through the notch and a lateral Hohmann retractor  placed, and we then drilled through the proximal tibia in line with the  axis of the tibia followed by an intramedullary guide rod and 3-degree  posterior slope cutting guide. The tibial cutting guide was pinned into place  allowing resection of 4 mm of bone medially and about 11 mm of bone  laterally because of his varus deformity. Satisfied with the tibial resection, we then  entered the distal femur 2 mm anterior to the PCL origin with the  intramedullary guide rod and applied the distal femoral cutting guide  set at 11mm, with 5 degrees of valgus. This was pinned along the  epicondylar axis. At this point, the distal femoral cut was accomplished without difficulty. We then sized for a #8L femoral component and pinned the guide in 3 degrees of external rotation.The  chamfer cutting guide was pinned into place. The anterior, posterior, and chamfer cuts were accomplished without difficulty followed by  the Sigma RP box cutting guide and the box cut. We also removed posterior osteophytes from the posterior femoral condyles. At this  time, the knee was brought into full extension. We checked our  extension and flexion gaps and found them symmetric at 10mm.  The patella thickness measured at 28 mm. We set the cutting guide at 9.5 and removed the posterior 9.5 mm  of the patella, sized for a 41 anatomic button and drilled the lollipop. The knee  was then once again hyperflexed exposing the proximal tibia. We sized for a #9 tibial base plate, applied the smokestack and the conical reamer followed by the the Delta fin keel punch. We then hammered into place the Attune trial femoral component, inserted a 6-mm trial bearing, trial patellar button, and took the knee through range of motion from 0-130 degrees. No thumb pressure was required for patellar  tracking. At this point, all trial components were removed, a double batch of DePuy HV cement with 1500 mg of Zinacef was mixed and applied to all bony metallic mating surfaces except for the posterior condyles of the femur itself. In order, we  hammered into place the tibial tray and removed excess cement, the femoral component and removed excess cement, a Attune RP bearing  was inserted, and the knee brought to full extension with compression.  The patellar button was clamped into place, and excess cement  removed. While the cement cured the wound was irrigated out with normal saline solution pulse lavage, and medium Hemovac drains were placed from an anterolateral  approach. Ligament stability and patellar tracking were checked and found to be excellent. The parapatellar arthrotomy was closed with  running #1 Vicryl suture. The subcutaneous tissue with 0 and 2-0 undyed  Vicryl suture, and the skin with skin staples. A  dressing of Xeroform,  4 x 4, dressing sponges, Webril, and Ace wrap applied. The patient  awakened, extubated, and taken to recovery room without difficulty.  Nestor Lewandowsky 04/16/2014, 10:59 AM

## 2014-04-16 NOTE — Care Management Utilization Note (Signed)
Utilization review completed. Dacey Milberger, RN BSN 

## 2014-04-17 ENCOUNTER — Encounter (HOSPITAL_COMMUNITY): Payer: Self-pay | Admitting: Orthopedic Surgery

## 2014-04-17 LAB — TYPE AND SCREEN
ABO/RH(D): A POS
ANTIBODY SCREEN: NEGATIVE
UNIT DIVISION: 0
Unit division: 0
Unit division: 0
Unit division: 0

## 2014-04-17 LAB — CBC
HEMATOCRIT: 33.1 % — AB (ref 39.0–52.0)
Hemoglobin: 11.3 g/dL — ABNORMAL LOW (ref 13.0–17.0)
MCH: 30.8 pg (ref 26.0–34.0)
MCHC: 34.1 g/dL (ref 30.0–36.0)
MCV: 90.2 fL (ref 78.0–100.0)
PLATELETS: 275 10*3/uL (ref 150–400)
RBC: 3.67 MIL/uL — ABNORMAL LOW (ref 4.22–5.81)
RDW: 13.7 % (ref 11.5–15.5)
WBC: 13.1 10*3/uL — AB (ref 4.0–10.5)

## 2014-04-17 NOTE — Clinical Social Work Placement (Addendum)
Clinical Social Work Department  CLINICAL SOCIAL WORK PLACEMENT NOTE   Patient: Derrick [name]  Account Number: [id number]  Admit date:  04/16/14 Clinical Social Worker: Sabino NiemannAmy Stuckey LCSWA Date/time: 04/17/2014 11:30 AM  Clinical Social Work is seeking post-discharge placement for this patient at the following level of care: SKILLED NURSING (*CSW will update this form in Epic as items are completed)  04/17/2014 Patient/family provided with [facility] Department of Clinical Social Work's list of facilities offering this level of care within the geographic area requested by the patient (or if unable, by the patient's family).  04/17/2014  Patient/family informed of their freedom to choose among providers that offer the needed level of care, that participate in Medicare, Medicaid or managed care program needed by the patient, have an available bed and are willing to accept the patient.  04/17/2014 Patient/family informed of [facility]' ownership interest in [facility], as well as of the fact that they are under no obligation to receive care at this facility.  PASARR submitted to EDS on 04/20/2014 PASARR number received from EDS on 04/20/2014 FL2 transmitted to all facilities in geographic area requested by pt/family on 04/17/2014  FL2 transmitted to all facilities within larger geographic area on  Patient informed that his/her managed care company has contracts with or will negotiate with certain facilities, including the following:  Patient/family informed of bed offers received: 04/17/2014  [facility] Patient to be transferred to on  04/23/2014  Patient to be transferred to facility by EMS The following physician request were entered in Epic:  Additional Comments:

## 2014-04-17 NOTE — Evaluation (Signed)
Physical Therapy Evaluation Patient Details Name: Derrick [name] MRN: [id number] DOB: Apr 13, 1956 Today's Date: 04/17/2014   History of Present Illness  s/p bilateral knee arthroplasties  Clinical Impression  Patient presents with decreased independence with mobility due to deficits listed in PT problem list.  He will benefit from skilled PT in the acute setting to allow return home with family assist after SNF level rehab stay.      Follow Up Recommendations SNF    Equipment Recommendations  Rolling walker with 5" wheels;3in1 (PT)    Recommendations for Other Services       Precautions / Restrictions Precautions Precautions: Knee Required Braces or Orthoses: Knee Immobilizer - Right;Knee Immobilizer - Left Restrictions Weight Bearing Restrictions: Yes RLE Weight Bearing: Weight bearing as tolerated LLE Weight Bearing: Weight bearing as tolerated      Mobility  Bed Mobility Overal bed mobility: Needs Assistance Bed Mobility: Supine to Sit     Supine to sit: HOB elevated;Mod assist     General bed mobility comments: assist for bilateral LE's, used rail and asked to elevate HOB  Transfers Overall transfer level: Needs assistance Equipment used: Rolling walker (2 wheeled) Transfers: Sit to/from [employer] Sit to Stand: +2 physical assistance;Mod assist;From elevated surface Stand pivot transfers: Min assist       General transfer comment: cues from elevated surface of bed to stand with UE assist; turned with walker about 2' to chair to sit with cues and assist for technique  Ambulation/Gait             General Gait Details: did not want to walk yet, but able to take steps with walker to chair  Stairs            Wheelchair Mobility    Modified Rankin (Stroke Patients Only)       Balance Overall balance assessment: Needs assistance Sitting-balance support: Feet supported Sitting balance-Leahy Scale: Fair     Standing balance  support: Bilateral upper extremity supported Standing balance-Leahy Scale: Poor Standing balance comment: needs UE assist due to bilateral knee pain/surgery                             Pertinent Vitals/Pain 6-7/10 (premedicated)    Home Living Family/patient expects to be discharged to:: Skilled nursing facility Living Arrangements: Spouse/significant other                    Prior Function Level of Independence: Independent         Comments: no equipment at home     Hand Dominance        Extremity/Trunk Assessment   Upper Extremity Assessment: Generalized weakness           Lower Extremity Assessment: RLE deficits/detail;LLE deficits/detail RLE Deficits / Details: ankle AROM WFL, knee AAROM -10 to 35 degrees  LLE Deficits / Details: ankle AROM WFL, knee AAROM -10 to 35 degrees      Communication   Communication: No difficulties  Cognition Arousal/Alertness: Awake/alert Behavior During Therapy: WFL for tasks assessed/performed Overall Cognitive Status: Within Functional Limits for tasks assessed                      General Comments      Exercises Total Joint Exercises Ankle Circles/Pumps: AROM;Both;5 reps;Supine Quad Sets: AROM;Both;5 reps;Supine Heel Slides: AAROM;Both;5 reps;Supine      Assessment/Plan    PT Assessment Patient needs continued PT  services  PT Diagnosis Difficulty walking;Acute pain   PT Problem List Decreased strength;Decreased range of motion;Decreased activity tolerance;Decreased balance;Pain;Decreased knowledge of use of DME;Decreased mobility  PT Treatment Interventions DME instruction;Gait training;Therapeutic exercise;Balance training;Functional mobility training;Therapeutic activities;Patient/family education   PT Goals (Current goals can be found in the Care Plan section) Acute Rehab PT Goals Patient Stated Goal: To go to rehab PT Goal Formulation: With patient/family Time For Goal Achievement:  05/01/14 Potential to Achieve Goals: Good    Frequency Min 6X/week   Barriers to discharge        Co-evaluation               End of Session Equipment Utilized During Treatment: Gait belt;Left knee immobilizer;Right knee immobilizer Activity Tolerance: Patient tolerated treatment well Patient left: in chair;with call bell/phone within reach;with family/visitor present           Time: 8119-1478 PT Time Calculation (min): 26 min   Charges:   PT Evaluation $Initial PT Evaluation Tier I: 1 Procedure PT Treatments $Therapeutic Activity: 8-22 mins   PT G Codes:          Ane Payment 04/17/2014, 1:07 PM [name], PT [phone] 04/17/2014

## 2014-04-17 NOTE — Plan of Care (Signed)
Problem: Consults Goal: Diagnosis- Total Joint Replacement Primary Total Knee Bilateral (Right and Left) knee replacement

## 2014-04-17 NOTE — Care Management Note (Signed)
CARE MANAGEMENT NOTE 04/17/2014  Patient:  Deist,Ahmaad A   Account Number:  [id number]  Date Initiated:  04/16/2014  Documentation initiated by:  Vance PeperBRADY,Zoriah Pulice  Subjective/Objective Assessment:   58 yr old male s/p bilateral knee arthroplasties.     Action/Plan:   Case manager spoke briefly with patient. [facility]. Socail worker notified.  PT/OT to eval.   Anticipated DC Date:  04/18/2014   Anticipated DC Plan:  SKILLED NURSING FACILITY  In-house referral  Clinical Social Worker      DC Planning Services  CM consult      Choice offered to / List presented to:             [facility]  [facility]   Status of service:  Completed, signed off Medicare Important Message given?   (If response is "NO", the following Medicare IM given date fields will be blank) Date Medicare IM given:   Date Additional Medicare IM given:    Discharge Disposition:  SKILLED NURSING FACILITY  Per UR Regulation:    If discussed at Long Length of Stay Meetings, dates discussed:    Comments:

## 2014-04-17 NOTE — Progress Notes (Signed)
Patient ID: Derrick PaganiniDavid A Ogan, male   DOB: 05-28-1956, 58 y.o.   MRN: [id number] PATIENT ID: Derrick PaganiniDavid A Hoak  MRN: [id number]  DOB/AGE:  05-28-1956 / 58 y.o.  1 Day Post-Op Procedure(s) (LRB): TOTAL KNEE BILATERAL (Bilateral)    PROGRESS NOTE Subjective: Patient is alert, oriented, no Nausea, no Vomiting, yes passing gas, no Bowel Movement. Taking PO well. Denies SOB, Chest or Calf Pain. Using Incentive Spirometer, PAS in place. Ambulate WBAT today, CPM 0-60 Patient reports pain as 4 on 0-10 scale  .    Objective: Vital signs in last 24 hours: Filed Vitals:   04/16/14 2100 04/17/14 0000 04/17/14 0034 04/17/14 0519  BP:   110/67 121/68  Pulse: 120  118 104  Temp:   98.4 F (36.9 C) 98.5 F (36.9 C)  TempSrc:   Oral Oral  Resp: 18 18 18 18   Height:      Weight:      SpO2: 95% 99% 96% 96%      Intake/Output from previous day: I/O last 3 completed shifts: In: 2740 [P.O.:240; I.V.:2500] Out: 3620 [Urine:2350; Drains:1070; Blood:200]   Intake/Output this shift:     LABORATORY DATA:  Recent Labs  04/17/14 0609  WBC 13.1*  HGB 11.3*  HCT 33.1*  PLT 275    Examination: Neurologically intact ABD soft Neurovascular intact Sensation intact distally Intact pulses distally Dorsiflexion/Plantar flexion intact Incision: no drainage No cellulitis present Compartment soft}  Assessment:   1 Day Post-Op Procedure(s) (LRB): TOTAL KNEE BILATERAL (Bilateral) ADDITIONAL DIAGNOSIS:  HTN  Plan: PT/OT WBAT, CPM 5/hrs day until ROM 0-90 degrees, then D/C CPM DVT Prophylaxis:  SCDx72hrs, ASA 325 mg BID x 2 weeks DISCHARGE PLAN: Skilled Nursing Facility/Rehab, [facility] DISCHARGE NEEDS: HHPT, HHRN, CPM, Walker and 3-in-1 comode seat     Nestor LewandowskyFrank J Aldin Drees 04/17/2014, 7:03 AM

## 2014-04-17 NOTE — Progress Notes (Signed)
Physical Therapy Treatment Patient Details Name: Derrick [name] MRN: [id number] DOB: November 19, 1956 Today's Date: 04/17/2014    History of Present Illness s/p bilateral knee arthroplasties    PT Comments    Patient progressing with transfers.  Likely ready for ambulation tomorrow.  Follow Up Recommendations  SNF     Equipment Recommendations  Rolling walker with 5" wheels;3in1 (PT)    Recommendations for Other Services       Precautions / Restrictions Precautions Precautions: Knee Required Braces or Orthoses: Knee Immobilizer - Right;Knee Immobilizer - Left Restrictions RLE Weight Bearing: Weight bearing as tolerated LLE Weight Bearing: Weight bearing as tolerated    Mobility  Bed Mobility Overal bed mobility: Needs Assistance Bed Mobility: Sit to Supine     Supine to sit: HOB elevated;Mod assist Sit to supine: Mod assist   General bed mobility comments: assist for bilateral LE's into bed, cues and bed in trendelenberg with use of bed rail vs OHT for scooting up in bed  Transfers Overall transfer level: Needs assistance Equipment used: Rolling walker (2 wheeled) Transfers: Sit to/from Stand Sit to Stand: +2 physical assistance;Mod assist;From elevated surface Stand pivot transfers: +2 physical assistance;Min assist       General transfer comment: up from built up recliner with cues for technique and increased time, pivotal steps back to bed   Ambulation/Gait             General Gait Details: did not want to walk yet, but able to take steps with walker to chair   Stairs            Wheelchair Mobility    Modified Rankin (Stroke Patients Only)       Balance Overall balance assessment: Needs assistance Sitting-balance support: Feet supported Sitting balance-Leahy Scale: Fair     Standing balance support: Bilateral upper extremity supported Standing balance-Leahy Scale: Poor Standing balance comment: UE assist on walker for mobility                     Cognition Arousal/Alertness: Awake/alert Behavior During Therapy: WFL for tasks assessed/performed Overall Cognitive Status: Within Functional Limits for tasks assessed                      Exercises Total Joint Exercises Ankle Circles/Pumps: AROM;Both;5 reps;Supine Quad Sets: AROM;Both;5 reps;Supine Heel Slides: AAROM;Both;5 reps;Supine    General Comments        Pertinent Vitals/Pain Pinching pain right knee>left, not rated    Home Living Family/patient expects to be discharged to:: Skilled nursing facility Living Arrangements: Spouse/significant other                  Prior Function Level of Independence: Independent      Comments: no equipment at home   PT Goals (current goals can now be found in the care plan section) Acute Rehab PT Goals Patient Stated Goal: To go to rehab PT Goal Formulation: With patient/family Time For Goal Achievement: 05/01/14 Potential to Achieve Goals: Good Progress towards PT goals: Progressing toward goals    Frequency  Min 6X/week    PT Plan Current plan remains appropriate    Co-evaluation             End of Session Equipment Utilized During Treatment: Gait belt;Left knee immobilizer;Right knee immobilizer Activity Tolerance: Patient tolerated treatment well Patient left: in CPM;in bed;with call bell/phone within reach;with family/visitor present     Time: 2130-8657 PT Time Calculation (min): 24 min  Charges:  $Therapeutic Exercise: 23-37 mins $Therapeutic Activity: 8-22 mins                    G Codes:      Ane Payment 05-06-14, 3:01 PM [name], [place] [phone] May 06, 2014

## 2014-04-17 NOTE — Evaluation (Signed)
Occupational Therapy Evaluation Patient Details Name: STATHAM STORMONT MRN: [id number] DOB: 1955/12/30 Today's Date: 04/17/2014    History of Present Illness s/p bilateral knee arthroplasties   Clinical Impression   Pt admitted with the above diagnoses and presents with below problem list. Pt will benefit from continued acute OT to address the below listed deficits and maximize independence with basic ADLs prior to d/c. Pt performed bed mobility tasks with min A. Pt experiencing nausea. Recommending SNF for continued rehab to increase independence prior to returning home.    Follow Up Recommendations  SNF    Equipment Recommendations  3 in 1 bedside comode    Recommendations for Other Services       Precautions / Restrictions Precautions Precautions: Knee Required Braces or Orthoses: Knee Immobilizer - Right;Knee Immobilizer - Left (KIs in room) Restrictions Weight Bearing Restrictions: Yes RLE Weight Bearing: Weight bearing as tolerated LLE Weight Bearing: Weight bearing as tolerated      Mobility Bed Mobility Overal bed mobility: Needs Assistance Bed Mobility: Supine to Sit     Supine to sit: Min assist;HOB elevated (used bed railing during transfer)     General bed mobility comments: min A and extra time for supine <>EOB  Transfers Overall transfer level: Needs assistance                    Balance                                            ADL Overall ADL's : Needs assistance/impaired Eating/Feeding: Set up;Sitting   Grooming: Set up;Sitting   Upper Body Bathing: Sitting;Supervision/ safety   Lower Body Bathing: Maximal assistance;With adaptive equipment   Upper Body Dressing : Set up;Sitting   Lower Body Dressing: Maximal assistance;Sit to/from stand   Toilet Transfer: Maximal assistance;Stand-pivot;BSC;RW;Requires wide/bariatric   Toileting- Clothing Manipulation and Hygiene: Maximal assistance   Tub/ Shower Transfer:  Maximal assistance;Ambulation;Grab bars;Rolling walker;Tub bench;3 in 1   Functional mobility during ADLs: +2 for safety/equipment;Moderate assistance;Maximal assistance;Rolling walker General ADL Comments: performed supine <> EOB with min A for BLE support; pt unable to bridge; nausea limiting. Sat EOB 5 minutes.     Vision                     Perception     Praxis      Pertinent Vitals/Pain 6/10. Increased activity during session.     Hand Dominance     Extremity/Trunk Assessment Upper Extremity Assessment Upper Extremity Assessment: Generalized weakness   Lower Extremity Assessment Lower Extremity Assessment: Defer to PT evaluation       Communication Communication Communication: No difficulties   Cognition Arousal/Alertness: Awake/alert Behavior During Therapy: WFL for tasks assessed/performed Overall Cognitive Status: Within Functional Limits for tasks assessed                     General Comments       Exercises       Shoulder Instructions      Home Living Family/patient expects to be discharged to:: Skilled nursing facility Living Arrangements: Spouse/significant other                                      Prior Functioning/Environment Level of Independence: Independent  OT Diagnosis: Acute pain;Generalized weakness   OT Problem List: Decreased strength;Decreased range of motion;Decreased activity tolerance;Impaired balance (sitting and/or standing);Decreased safety awareness;Decreased knowledge of use of DME or AE;Decreased knowledge of precautions;Obesity;Pain   OT Treatment/Interventions: Self-care/ADL training;Therapeutic exercise;Energy conservation;DME and/or AE instruction;Therapeutic activities;Patient/family education    OT Goals(Current goals can be found in the care plan section) Acute Rehab OT Goals Patient Stated Goal: not stated OT Goal Formulation: With patient Time For Goal Achievement:  04/24/14 Potential to Achieve Goals: Good ADL Goals Pt Will Perform Lower Body Bathing: with min assist;with adaptive equipment;sit to/from stand Pt Will Perform Lower Body Dressing: with mod assist;with adaptive equipment;sit to/from stand Pt Will Transfer to Toilet: ambulating;with min assist;grab bars (comfort height toilet or 3 n 1) Pt Will Perform Toileting - Clothing Manipulation and hygiene: with min assist;with adaptive equipment;sit to/from stand Pt Will Perform Tub/Shower Transfer: with min assist;ambulating;3 in 1;grab bars;rolling walker  OT Frequency: Min 3X/week   Barriers to D/C: Other (comment) (bilateral knee surgery)          Co-evaluation              End of Session Equipment Utilized During Treatment: Other (comment) (no equipment for bed mobility tasks) CPM Left Knee CPM Left Knee: On Left Knee Flexion (Degrees): 40 Left Knee Extension (Degrees): 0 CPM Right Knee CPM Right Knee: On Right Knee Flexion (Degrees): 40 Right Knee Extension (Degrees): 0  Activity Tolerance: Patient limited by fatigue;Other (comment) (nausea) Patient left: in bed;with call bell/phone within reach   Time: 0928-1012 OT Time Calculation (min): 44 min Charges:  OT General Charges $OT Visit: 1 Procedure OT Evaluation $Initial OT Evaluation Tier I: 1 Procedure OT Treatments $Self Care/Home Management : 8-22 mins $Therapeutic Activity: 8-22 mins G-Codes:    Pilar Grammes 04-24-14, 10:39 AM

## 2014-04-17 NOTE — Progress Notes (Signed)
Orthopedic Tech Progress Note Patient Details:  Anice PaganiniDavid A Mccolm March 09, 1956 [id number] Of cpm at 7:55 pm Patient ID: Anice PaganiniDavid A Lukens, male   DOB: March 09, 1956, [age] y.o.   MRN: [id number]   Jennye Moccasinnthony Craig Fady Stamps 04/17/2014, 7:55 PM

## 2014-04-18 LAB — CBC
HCT: 29.1 % — ABNORMAL LOW (ref 39.0–52.0)
HEMOGLOBIN: 10 g/dL — AB (ref 13.0–17.0)
MCH: 30.4 pg (ref 26.0–34.0)
MCHC: 34.4 g/dL (ref 30.0–36.0)
MCV: 88.4 fL (ref 78.0–100.0)
Platelets: 230 10*3/uL (ref 150–400)
RBC: 3.29 MIL/uL — ABNORMAL LOW (ref 4.22–5.81)
RDW: 13.8 % (ref 11.5–15.5)
WBC: 18 10*3/uL — ABNORMAL HIGH (ref 4.0–10.5)

## 2014-04-18 NOTE — Progress Notes (Signed)
PATIENT ID: Derrick PaganiniDavid A [name]  MRN: [id number]  DOB/AGE:  [birth year] / 56 y.o.  2 Days Post-Op Procedure(s) (LRB): TOTAL KNEE BILATERAL (Bilateral)    PROGRESS NOTE Subjective: Patient is alert, oriented, no Nausea, no Vomiting, yes passing gas, no Bowel Movement. Taking PO well. Denies SOB, Chest or Calf Pain. Using Incentive Spirometer, PAS in [name]. Ambulate WBAT, pt has worked on transfers and getting to his chair, but has not ambulated yet., CPM 0-60 Patient reports pain as moderate  .    Objective: Vital signs in last 24 hours: Filed Vitals:   04/17/14 2143 04/18/14 0000 04/18/14 0400 04/18/14 0650  BP: 112/80   99/51  Pulse:    103  Temp:    98.5 F (36.9 C)  TempSrc:    Oral  Resp:  18 16 20   Height:      Weight:      SpO2:  99% 98% 94%      Intake/Output from previous day: I/O last 3 completed shifts: In: 5312.9 [P.O.:1840; I.V.:3472.9] Out: 4200 [Urine:3200; Drains:1000]   Intake/Output this shift: Total I/O In: 360 [P.O.:360] Out: 150 [Urine:150]   LABORATORY DATA:  Recent Labs  04/17/14 0609 04/18/14 0420  WBC 13.1* 18.0*  HGB 11.3* 10.0*  HCT 33.1* 29.1*  PLT 275 230    Examination: Neurologically intact Neurovascular intact Sensation intact distally Intact pulses distally Dorsiflexion/Plantar flexion intact Incision: dressing C/D/I No cellulitis present Compartment soft}  Assessment:   2 Days Post-Op Procedure(s) (LRB): TOTAL KNEE BILATERAL (Bilateral) ADDITIONAL DIAGNOSIS:  Hypertension  Plan: PT/OT WBAT, CPM 5/hrs day until ROM 0-90 degrees, then D/C CPM DVT Prophylaxis:  SCDx72hrs, ASA 325 mg BID x 2 weeks DISCHARGE PLAN: Skilled Nursing Facility/Rehab, [name]  When pt meets PT goals. DISCHARGE NEEDS: HHPT, HHRN, CPM, Walker and 3-in-1 comode seat     Derrick [name] 04/18/2014, 8:05 AM

## 2014-04-18 NOTE — Progress Notes (Signed)
Seen and agreed 04/18/2014 Julia Elizabeth Robinette PTA [phone] pager [phone] office    

## 2014-04-18 NOTE — Progress Notes (Signed)
Physical Therapy Treatment Patient Details Name: Derrick [name] MRN: [id number] DOB: 1955-12-23 Today's Date: 04/18/2014    History of Present Illness s/p bilateral knee arthroplasties    PT Comments    Patient is motivated for therapy but requires increased time to perform tasks due to pain and weakness in bilateral LE.Marland Kitchen Patient able to tolerate therapeutic exercises. Attempted to perform gait training but c/o RLE weakness after transfer to RW. Repositioned in recliner for comfort. Continue to work with patient to increase functional independence. Continue to recommend SNF at this time to help A with mobility.   Follow Up Recommendations  SNF     Equipment Recommendations  Rolling walker with 5" wheels;3in1 (PT)    Recommendations for Other Services       Precautions / Restrictions Precautions Precautions: Knee Required Braces or Orthoses: Knee Immobilizer - Right;Knee Immobilizer - Left Restrictions RLE Weight Bearing: Weight bearing as tolerated LLE Weight Bearing: Weight bearing as tolerated    Mobility  Bed Mobility Overal bed mobility: Needs Assistance Bed Mobility: Sit to Supine     Supine to sit: HOB elevated;Mod assist     General bed mobility comments: Patient relied on bed rail to A to bring trunk upright. Patient requires A for bilateral LE.   Transfers Overall transfer level: Needs assistance Equipment used: Rolling walker (2 wheeled) Transfers: Sit to/from [employer] Sit to Stand: +2 physical assistance;Mod assist;From elevated surface Stand pivot transfers: +2 physical assistance;Min assist       General transfer comment: Patient required cues to scoot forward and for hand placement. Patient required increased time to transfer from bed to standing.  Ambulation/Gait Ambulation/Gait assistance: Mod assist Ambulation Distance (Feet): 4 Feet Assistive device: Rolling walker (2 wheeled) Gait Pattern/deviations: Step-to pattern;Decreased  stride length;Antalgic     General Gait Details: Patient attempt to use RW with gait training but c/o RLE weakness and was returned to recliner.   Stairs            Wheelchair Mobility    Modified Rankin (Stroke Patients Only)       Balance                                    Cognition Arousal/Alertness: Awake/alert Behavior During Therapy: WFL for tasks assessed/performed Overall Cognitive Status: Within Functional Limits for tasks assessed                      Exercises Total Joint Exercises Ankle Circles/Pumps: AROM;Both;Supine;15 reps Quad Sets: AROM;Both;Supine;15 reps Heel Slides: AAROM;Both;Supine;10 reps Hip ABduction/ADduction: AROM;Both;15 reps;Supine Straight Leg Raises: AAROM;Both;5 reps    General Comments        Pertinent Vitals/Pain Patient reports 7/10 pain level in RLE and 5/10 in LLE. Patient was repositioned for comfort.     Home Living                      Prior Function            PT Goals (current goals can now be found in the care plan section) Progress towards PT goals: Progressing toward goals    Frequency  Min 6X/week    PT Plan Current plan remains appropriate    Co-evaluation             End of Session Equipment Utilized During Treatment: Gait belt;Left knee immobilizer;Right knee immobilizer Activity Tolerance: Patient tolerated  treatment well Patient left: in chair;with call bell/phone within reach;with family/visitor present     Time: 1128-1210 PT Time Calculation (min): 42 min  Charges:                       G Codes:      Shana Zavaleta L Ermine Spofford, SPTA 04/18/2014, 12:22 PM

## 2014-04-18 NOTE — Clinical Social Work Psychosocial (Signed)
Clinical Social Work Department  BRIEF PSYCHOSOCIAL ASSESSMENT  Patient: Derrick [name]  Account Number:   Admit date:  Clinical Social Worker Rhea Pink, MSW Date/Time:  Referred by: Physician Date Referred:  Referred for   SNF Placement   Other Referral:  Interview type: Patient and patient's wife Other interview type: PSYCHOSOCIAL DATA  Living Status: private residence with spouse Admitted from facility:  Level of care:  Primary support name: Dawson Albers Primary support relationship to patient: Wife Degree of support available:  Strong and vested- at bedside  CURRENT CONCERNS  Current Concerns   Post-Acute Placement   Other Concerns:  SOCIAL WORK ASSESSMENT / PLAN  CSW met with pt at bedside to offer support and discuss placement. PAtient reported that he has pre-registered at [facility] and is excited about getting his rehab started. CSW told patient and his wife that [facility] was aware and were starting the auth process. Patient and wife expressed their appreciaition. re: PT recommendation for SNF.   Pt lives with Spouse  CSW explained placement process and answered questions.   Pt reports [facility]  as his preference    CSW completed FL2 and initiated SNF search.     Assessment/plan status: Information/Referral to [employer]  Other assessment/ plan:  Information/referral to community resources:  SNF     PATIENT'S/FAMILY'S RESPONSE TO PLAN OF CARE:  Pt  reports he is agreeable to [facility] SNF (he stated 2 weeks) in order to increase strength and independence with mobility praor to returning home  Pt verbalized understanding of placement process and appreciation for CSW assist.   Rhea Pink, MSW, [place]

## 2014-04-18 NOTE — Progress Notes (Signed)
Orthopedic Tech Progress Note Patient Details:  Anice PaganiniDavid A Cataldi 1956/02/04 [id number] On cpm at 8:10 pm (B) LE in creased 10 degrees 0-50 Patient ID: Anice Paganiniavid A Shrestha, male   DOB: 1956/02/04, 58 y.o.   MRN: [id number]   Jennye Moccasinnthony Craig Genisis Sonnier 04/18/2014, 8:11 PM

## 2014-04-18 NOTE — Progress Notes (Signed)
Patient is currently wearing FFM (home mask) at this time. Machine is set at 15.0 cm H2O per home settings.  Patient is able to place himself on/off as needed.

## 2014-04-18 NOTE — Progress Notes (Signed)
Pt states he can place himself on CPAP when ready later tonight. Checked machine and humidifier, and everything is ready for use. RT will continue to monitor.

## 2014-04-19 LAB — CBC
HCT: 27.3 % — ABNORMAL LOW (ref 39.0–52.0)
Hemoglobin: 9.3 g/dL — ABNORMAL LOW (ref 13.0–17.0)
MCH: 30.8 pg (ref 26.0–34.0)
MCHC: 34.1 g/dL (ref 30.0–36.0)
MCV: 90.4 fL (ref 78.0–100.0)
PLATELETS: 270 10*3/uL (ref 150–400)
RBC: 3.02 MIL/uL — AB (ref 4.22–5.81)
RDW: 14.1 % (ref 11.5–15.5)
WBC: 14.1 10*3/uL — AB (ref 4.0–10.5)

## 2014-04-19 NOTE — Progress Notes (Signed)
Physical Therapy Treatment Patient Details Name: Derrick [name] MRN: [id number] DOB: [birth year] Today's Date: 04/19/2014    History of Present Illness s/p bilateral knee arthroplasties    PT Comments    Patient is slowly progressing towards goals. Patient requires increased time with mobility. Patient able to tolerate therapeutic exercises. Patient in right knee immobilizer during gait training. Patient able to increase distance but became fatigue and requested to rest. Patient still requires skilled interventions for functional independence. Continue to work with patient so that he is able to progress towards goals. Continue to recommend SNF at this time for safety with mobility.    Follow Up Recommendations  SNF     Equipment Recommendations  Rolling walker with 5" wheels;3in1 (PT)    Recommendations for Other Services       Precautions / Restrictions Precautions Precautions: Knee Required Braces or Orthoses: Knee Immobilizer - Right;Knee Immobilizer - Left Restrictions RLE Weight Bearing: Weight bearing as tolerated LLE Weight Bearing: Weight bearing as tolerated    Mobility  Bed Mobility Overal bed mobility: Needs Assistance Bed Mobility: Supine to Sit     Supine to sit: HOB elevated;Min assist     General bed mobility comments: Patient relied on bed rail to A to bring trunk upright but able to control bilateral LE with cues for technique. Min A for cues  Transfers Overall transfer level: Needs assistance Equipment used: Rolling walker (2 wheeled) Transfers: Sit to/from Stand Sit to Stand: Mod assist;From elevated surface         General transfer comment: Patient required increased time to scoot forward. Patient required cues for safety of hand placement.  Ambulation/Gait Ambulation/Gait assistance: Mod assist Ambulation Distance (Feet): 6 Feet Assistive device: Rolling walker (2 wheeled) Gait Pattern/deviations: Step-to pattern;Decreased stride  length;Antalgic     General Gait Details: patient required increased time prior to forward amb. Patient able to step to with RW with increased time to perform.   Stairs            Wheelchair Mobility    Modified Rankin (Stroke Patients Only)       Balance                                    Cognition Arousal/Alertness: Awake/alert Behavior During Therapy: WFL for tasks assessed/performed Overall Cognitive Status: Within Functional Limits for tasks assessed                      Exercises Total Joint Exercises Ankle Circles/Pumps: AROM;Both;Supine;10 reps Quad Sets: AROM;Both;Supine;10 reps Short Arc QuadBarbaraann Boys;Both;5 reps;Supine Heel Slides: AAROM;Both;Supine;10 reps Hip ABduction/ADduction: AROM;Both;Supine;5 reps    General Comments        Pertinent Vitals/Pain Patient reports 8/10 pain level in RLE and 6/10 in LLE.    Home Living                      Prior Function            PT Goals (current goals can now be found in the care plan section) Progress towards PT goals: Progressing toward goals    Frequency  Min 6X/week    PT Plan Current plan remains appropriate    Co-evaluation             End of Session Equipment Utilized During Treatment: Gait belt;Right knee immobilizer Activity Tolerance: Patient tolerated treatment well Patient left: in  chair;with call bell/phone within reach     Time: 1036-1114 PT Time Calculation (min): 38 min  Charges:  $Gait Training: 8-22 mins $Therapeutic Exercise: 8-22 mins $Therapeutic Activity: 8-22 mins                    G Codes:      Verla Bryngelson L Leyton Magoon, SPTA 04/19/2014, 1:29 PM

## 2014-04-19 NOTE — Progress Notes (Signed)
Seen and agreed 04/19/2014 Carianne Taira Elizabeth Anjeanette Petzold PTA [phone] pager [phone] office    

## 2014-04-19 NOTE — Progress Notes (Addendum)
Patient ID: Derrick [name], male   DOB: 10-08-1956, 58 y.o.   MRN: [id number] PATIENT ID: Derrick [name]  MRN: [id number]  DOB/AGE:  56-19-1957 / 58 y.o.  3 Days Post-Op Procedure(s) (LRB): TOTAL KNEE BILATERAL (Bilateral)    PROGRESS NOTE Subjective: Patient is alert, oriented, no Nausea, no Vomiting, yes passing gas, no Bowel Movement. Taking PO well. Denies SOB, Chest or Calf Pain. Using Incentive Spirometer, PAS in place. Ambulate unable to ambulate yesterday secondary to weakness but doing leg slides and flexing these in bed today feels much stronger and should be able to ambulate weight bearing as tolerated, CPM 0-80 Patient reports pain as 3 on 0-10 scale  .    Objective: Vital signs in last 24 hours: Filed Vitals:   04/19/14 0400 04/19/14 0613 04/19/14 0614 04/19/14 0641  BP:  108/56 80/50 96/52   Pulse:  104    Temp:  98.8 F (37.1 C)    TempSrc:  Oral    Resp: 18 18    Height:      Weight:      SpO2:  91%        Intake/Output from previous day: I/O last 3 completed shifts: In: 2127.5 [P.O.:840; I.V.:1287.5] Out: 3850 [Urine:3850]   Intake/Output this shift:     LABORATORY DATA:  Recent Labs  04/17/14 0609 04/18/14 0420  WBC 13.1* 18.0*  HGB 11.3* 10.0*  HCT 33.1* 29.1*  PLT 275 230    Examination: Neurologically intact ABD soft Neurovascular intact Sensation intact distally Intact pulses distally Dorsiflexion/Plantar flexion intact Incision: dressing C/D/I No cellulitis present Compartment soft}  Assessment:   3 Days Post-Op Procedure(s) (LRB): TOTAL KNEE BILATERAL (Bilateral) ADDITIONAL DIAGNOSIS:  Sleep Apnea, HTN  Plan: PT/OT WBAT, CPM 5/hrs day until ROM 0-90 degrees, then D/C CPM, bilateral dressing changes today. DVT Prophylaxis:  SCDx72hrs, ASA 325 mg BID x 2 weeks DISCHARGE PLAN: [facility] when patient is able to ambulate and we have permission from his insurance carrier. Hopefully later today if he ambulates  or tomorrow. DISCHARGE NEEDS: HHPT, HHRN, CPM, Walker and 3-in-1 comode seat     Nestor LewandowskyFrank J Holleigh Crihfield 04/19/2014, 7:31 AM

## 2014-04-19 NOTE — Progress Notes (Signed)
Pt stated that he is able to put self on CPAP. Our CPAP is at bedside. RT explained for patient to call if he needed any help.

## 2014-04-19 NOTE — Progress Notes (Signed)
Awaiting [employer] auth.  Sabino NiemannAmy Menno Vanbergen, MSW, [employer]WA [phone]

## 2014-04-20 DIAGNOSIS — M1710 Unilateral primary osteoarthritis, unspecified knee: Secondary | ICD-10-CM

## 2014-04-20 DIAGNOSIS — Z96659 Presence of unspecified artificial knee joint: Secondary | ICD-10-CM

## 2014-04-20 NOTE — Progress Notes (Signed)
PATIENT ID: Derrick PaganiniDavid A [name]  MRN: [id number]  DOB/AGE:  [birth year] / 58 y.o.  4 Days Post-Op Procedure(s) (LRB): TOTAL KNEE BILATERAL (Bilateral)    PROGRESS NOTE Subjective: Patient is alert, oriented, no Nausea, no Vomiting, yes passing gas, yes Bowel Movement. Taking PO well. Denies SOB, Chest or Calf Pain. Using Incentive Spirometer, PAS in place. Ambulate WBAT, CPM 0-65 Patient reports pain as moderate  .    Objective: Vital signs in last 24 hours: Filed Vitals:   04/19/14 2059 04/20/14 0000 04/20/14 0358 04/20/14 0546  BP: 112/50   93/50  Pulse: 95   93  Temp: 97.7 F (36.5 C)   98.2 F (36.8 C)  TempSrc: Oral   Oral  Resp: 18 17 17 18   Height:      Weight:      SpO2: 99%   91%      Intake/Output from previous day: I/O last 3 completed shifts: In: 1320 [P.O.:1320] Out: 1950 [Urine:1950]   Intake/Output this shift: Total I/O In: 240 [P.O.:240] Out: 600 [Urine:600]   LABORATORY DATA:  Recent Labs  04/18/14 0420 04/19/14 0707  WBC 18.0* 14.1*  HGB 10.0* 9.3*  HCT 29.1* 27.3*  PLT 230 270    Examination: Neurologically intact Neurovascular intact Sensation intact distally Intact pulses distally Dorsiflexion/Plantar flexion intact Incision: dressing C/D/I No cellulitis present Compartment soft}  Assessment:   4 Days Post-Op Procedure(s) (LRB): TOTAL KNEE BILATERAL (Bilateral) ADDITIONAL DIAGNOSIS:  Hypertension and Sleep Apnea  Plan: PT/OT WBAT, CPM 5/hrs day until ROM 0-90 degrees, then D/C CPM DVT Prophylaxis:  SCDx72hrs, ASA 325 mg BID x 2 weeks DISCHARGE PLAN: Skilled Nursing Facility/Rehab when bed available and approved by insurance DISCHARGE NEEDS: HHPT, HHRN, CPM, Walker and 3-in-1 comode seat     Derrick [name] K Derrick [name] 04/20/2014, 11:50 AM

## 2014-04-20 NOTE — Progress Notes (Signed)
PT Cancellation Note  Patient Details Name: Derrick [name] MRN: [id number] DOB: 13-Aug-1956   Cancelled Treatment:    Reason Eval/Treat Not Completed: Fatigue/lethargy limiting ability to participate. Patient requested to have therapy after lunch. Will attempt to see later today, AS SCHEDULE ALLOWS   Adline PotterJulia Elizabeth Karter Hellmer 04/20/2014, 11:32 AM

## 2014-04-20 NOTE — Progress Notes (Signed)
Per Donne Hazel[facility]aron Lowa [facility]  Sabino NiemannAmy Jesusmanuel Erbes, MSW, [employer]WA [phone]

## 2014-04-20 NOTE — Care Management Note (Signed)
04/20/14 1600 Vance PeperSusan Leander Tout, RN BSN Case Manager Per social worker, patient may be [facility], but will be approved for CIR. Case manager contacted Minerva AreolaEric, Dr.Rowan's PA to relay this message. Inpatient rehab consult received. Case manager called Roderic PalauGenie Logue, admisson co-ordinator to discuss. CM also speaking with patient concerning discharge plan.

## 2014-04-20 NOTE — Progress Notes (Signed)
PT was already wearing CPAP upon RT arrival. He stated that everything was great. RT explained if he needed anything to please call. Pt understands.

## 2014-04-20 NOTE — Discharge Summary (Addendum)
Patient ID: Derrick [name] MRN: [id number] DOB/AGE: 07/06/1956 58 y.o.  Admit date: 04/16/2014 Discharge date: 04/23/2014  Admission Diagnoses:  Principal Problem:   Arthritis of both knees Active Problems:   Status post total bilateral knee replacement   Discharge Diagnoses:  Same  Past Medical History  Diagnosis Date  . Hypertension   . Arthritis   . Sleep apnea     cpap       last study 99  . Obese     Surgeries: Procedure(s): TOTAL KNEE BILATERAL on 04/16/2014   Consultants:    Discharged Condition: Improved  Hospital Course: Derrick [name] is an 58 y.o. male who was admitted 04/16/2014 for operative treatment ofArthritis of both knees. Patient has severe unremitting pain that affects sleep, daily activities, and work/hobbies. After pre-op clearance the patient was taken to the operating room on 04/16/2014 and underwent  Procedure(s): TOTAL KNEE BILATERAL.    Patient was given perioperative antibiotics: Anti-infectives   Start     Dose/Rate Route Frequency Ordered Stop   04/16/14 0600  ceFAZolin (ANCEF) 3 g in dextrose 5 % 50 mL IVPB     3 g 160 mL/hr over 30 Minutes Intravenous On call to O.R. 04/15/14 1256 04/16/14 0816       Patient was given sequential compression devices, early ambulation, and chemoprophylaxis to prevent DVT.  Patient benefited maximally from hospital stay and there were no complications.    Recent vital signs: Patient Vitals for the past 24 hrs:  BP Temp Temp src Pulse Resp SpO2  04/23/14 0432 124/48 mmHg 98.1 F (36.7 C) Oral 88 18 100 %  04/22/14 1940 103/42 mmHg 98.2 F (36.8 C) Oral 93 16 99 %     Recent laboratory studies: No results found for this basename: WBC, HGB, HCT, PLT, NA, K, CL, CO2, BUN, CREATININE, GLUCOSE, PT, INR, CALCIUM, 2,  in the last 72 hours   Discharge Medications:     Medication List    STOP taking these medications       acetaminophen 650 MG CR tablet  Commonly known as:  TYLENOL     aspirin 81 MG tablet   Replaced by:  aspirin EC 325 MG tablet     naproxen sodium 220 MG tablet  Commonly known as:  ANAPROX     traMADol 50 MG tablet  Commonly known as:  ULTRAM      TAKE these medications       aspirin EC 325 MG tablet  Take 1 tablet (325 mg total) by mouth 2 (two) times daily.     cyclobenzaprine 10 MG tablet  Commonly known as:  FLEXERIL  Take 1 tablet (10 mg total) by mouth 2 (two) times daily.     lisinopril 20 MG tablet  Commonly known as:  PRINIVIL,ZESTRIL  Take 20 mg by mouth at bedtime.     multivitamin tablet  Take 1 tablet by mouth daily.     oxyCODONE-acetaminophen 5-325 MG per tablet  Commonly known as:  ROXICET  Take 1 tablet by mouth every 4 (four) hours as needed.     simvastatin 20 MG tablet  Commonly known as:  ZOCOR  Take 20 mg by mouth every other day.        Diagnostic Studies: Dg Chest 2 View  04/06/2014   CLINICAL DATA:  Preoperative evaluation for knee surgery  EXAM: CHEST  2 VIEW  COMPARISON:  04/04/2007  FINDINGS: The heart size and mediastinal contours are within normal limits. Both  lungs are clear. The visualized skeletal structures are unremarkable.  IMPRESSION: No active cardiopulmonary disease.   Electronically Signed   By: Alcide Clever M.D.   On: 04/06/2014 10:46    Disposition:       Discharge Orders   Future Orders Complete By Expires   Call MD / Call 911  As directed    Change dressing  As directed    Constipation Prevention  As directed    CPM  As directed    Diet - low sodium heart healthy  As directed    Discharge instructions  As directed    Driving restrictions  As directed    Increase activity slowly as tolerated  As directed    Patient may shower  As directed       Follow-up Information   Follow up with Nestor Lewandowsky, MD In 2 weeks.   Specialty:  Orthopedic Surgery   Contact information:   [address] [place] [address] [phone]        Signed: Allena Katz 04/23/2014, 1:52 PM

## 2014-04-20 NOTE — Consult Note (Signed)
Physical Medicine and Rehabilitation Consult  Reason for Consult: Endstage OA bilateral knees.  Referring Physician: Dr. Turner Danielsowan.    HPI: Derrick [name] is a 58 y.o. male with history of HTN, OSA, morbid obesity, OA bilateral knees with failure of conservative therapy. Patient elected to undergo B-TKR on 04/20/14 by Dr. Turner Danielsowan. Post op with ABLA as well as reactive leucocytosis. Therapies ongoing but patient with poor endurance and difficulty with mobility. CIR recommended by MD as well as [employer] for progressive therapies.     Review of Systems  Constitutional: Positive for malaise/fatigue. Negative for diaphoresis.  Cardiovascular: Positive for leg swelling. Negative for chest pain.  Gastrointestinal: Negative for nausea and vomiting.  Musculoskeletal: Positive for joint pain.  Neurological: Negative for sensory change and speech change.    Past Medical History  Diagnosis Date  . Hypertension   . Arthritis   . Sleep apnea     cpap       last study 99  . Obese    Past Surgical History  Procedure Laterality Date  . Back surgery    . Hernia repair    . Total knee arthroplasty Bilateral 04/16/2014    Procedure: TOTAL KNEE BILATERAL;  Surgeon: Nestor LewandowskyFrank J Rowan, MD;  Location: [facility] OR;  Service: Orthopedics;  Laterality: Bilateral;  Bilateral nerve blocks; General anesthesia   History reviewed. No pertinent family history.  Social History:  reports that he has never smoked. He has never used smokeless tobacco. He reports that he does not drink alcohol or use illicit drugs.  Allergies: No Known Allergies  Medications Prior to Admission  Medication Sig Dispense Refill  . acetaminophen (TYLENOL) 650 MG CR tablet Take 1,300 mg by mouth 2 (two) times daily.      Marland Kitchen. aspirin 81 MG tablet Take 81 mg by mouth daily.      Marland Kitchen. lisinopril (PRINIVIL,ZESTRIL) 20 MG tablet Take 20 mg by mouth at bedtime.       . Multiple Vitamin (MULTIVITAMIN) tablet Take 1 tablet by mouth daily.      . naproxen  sodium (ANAPROX) 220 MG tablet Take 440 mg by mouth 2 (two) times daily as needed.      . simvastatin (ZOCOR) 20 MG tablet Take 20 mg by mouth every other day.      . traMADol (ULTRAM) 50 MG tablet Take 50 mg by mouth 2 (two) times daily.      . [DISCONTINUED] cyclobenzaprine (FLEXERIL) 10 MG tablet Take 10 mg by mouth 2 (two) times daily.        Home: Home Living Family/patient expects to be discharged to:: Skilled nursing facility Living Arrangements: Spouse/significant other  Functional History: Prior Function Level of Independence: Independent Comments: no equipment at home Functional Status:  Mobility: Bed Mobility Overal bed mobility: Needs Assistance Bed Mobility: Supine to Sit Supine to sit: HOB elevated;Min assist Sit to supine: Mod assist General bed mobility comments: Patient relied on bed rail to A to bring trunk upright but able to control bilateral LE with cues for technique. Min A for cues Transfers Overall transfer level: Needs assistance Equipment used: Rolling walker (2 wheeled) Transfers: Sit to/from Stand Sit to Stand: Mod assist;From elevated surface Stand pivot transfers: +2 physical assistance;Min assist General transfer comment: Patient required increased time to scoot forward. Patient required cues for safety of hand placement. Ambulation/Gait Ambulation/Gait assistance: Mod assist Ambulation Distance (Feet): 6 Feet Assistive device: Rolling walker (2 wheeled) Gait Pattern/deviations: Step-to pattern;Decreased stride length;Antalgic General  Gait Details: patient required increased time prior to forward amb. Patient able to step to with RW with increased time to perform.    ADL: ADL Overall ADL's : Needs assistance/impaired Eating/Feeding: Set up;Sitting Grooming: Set up;Sitting Upper Body Bathing: Sitting;Supervision/ safety Lower Body Bathing: Maximal assistance;With adaptive equipment Upper Body Dressing : Set up;Sitting Lower Body Dressing:  Maximal assistance;Sit to/from stand Toilet Transfer: Maximal assistance;Stand-pivot;BSC;RW;Requires wide/bariatric Toileting- Clothing Manipulation and Hygiene: Maximal assistance Tub/ Shower Transfer: Maximal assistance;Ambulation;Grab bars;Rolling walker;Tub bench;3 in 1 Functional mobility during ADLs: +2 for safety/equipment;Moderate assistance;Maximal assistance;Rolling walker General ADL Comments: performed supine <> EOB with min A for BLE support; pt unable to bridge; nausea limiting. Sat EOB 5 minutes.  Cognition: Cognition Overall Cognitive Status: Within Functional Limits for tasks assessed Orientation Level: Oriented X4 Cognition Arousal/Alertness: Awake/alert Behavior During Therapy: WFL for tasks assessed/performed Overall Cognitive Status: Within Functional Limits for tasks assessed  Blood pressure 136/54, pulse 95, temperature 98.8 F (37.1 C), temperature source Oral, resp. rate 18, height 6' (1.829 m), weight 148.3 kg (326 lb 15.1 oz), SpO2 99.00%. Physical Exam  Constitutional: He is oriented to person, place, and time. He appears well-developed. No distress.  Morbidly obese  HENT:  Head: Normocephalic and atraumatic.  Right Ear: External ear normal.  Left Ear: External ear normal.  Eyes: Conjunctivae and EOM are normal. Pupils are equal, round, and reactive to light.  Neck: No JVD present. No tracheal deviation present. No thyromegaly present.  Cardiovascular: Normal rate.   Respiratory: Effort normal.  GI: He exhibits no distension. There is no tenderness.  Musculoskeletal:  Knees with 55-60deg with PROM. Both wrapped and appropriately tender. Cannot do a straight leg lift. Both legs with appropriate edema.   Lymphadenopathy:    He has no cervical adenopathy.  Neurological: He is alert and oriented to person, place, and time.  RUE: deltoid 5/5, bicep 5/5, tricep 5/5, wrist ext 5/5, hand intrinsics 5/5 LUE: deltoid 5/5, bicep 5/5, triceps 5/5, wrist ext 5/5,  hand instrincs 5/5  BLE 1/5 HF and KE and 5/5 at ankles.  No sensory findings    No results found for this or any previous visit (from the past 24 hour(s)). No results found.  Assessment/Plan: Diagnosis: bilateral endstage OA s/p bilateral TKA's POD #4 1. Does the need for close, 24 hr/day medical supervision in concert with the patient's rehab needs make it unreasonable for this patient to be served in a less intensive setting? Yes 2. Co-Morbidities requiring supervision/potential complications: sedation, pain mgt, morbid obesity 3. Due to bladder management, bowel management, safety, skin/wound care, disease management, medication administration, pain management and patient education, does the patient require 24 hr/day rehab nursing? Yes 4. Does the patient require coordinated care of a physician, rehab nurse, PT (1-2 hrs/day, 5 days/week) and OT (1-2 hrs/day, 5 days/week) to address physical and functional deficits in the context of the above medical diagnosis(es)? Yes Addressing deficits in the following areas: balance, endurance, locomotion, strength, transferring, bowel/bladder control, bathing, dressing, feeding, grooming, toileting and psychosocial support 5. Can the patient actively participate in an intensive therapy program of at least 3 hrs of therapy per day at least 5 days per week? Yes 6. The potential for patient to make measurable gains while on inpatient rehab is excellent 7. Anticipated functional outcomes upon discharge from inpatient rehab are modified independent  with PT, modified independent with OT, n/a with SLP. 8. Estimated rehab length of stay to reach the above functional goals is: 7-9  days 9. Does  the patient have adequate social supports to accommodate these discharge functional goals? Yes 10. Anticipated D/C setting: Home 11. Anticipated post D/C treatments: HH therapy and Outpatient therapy 12. Overall Rehab/Functional Prognosis:  excellent  RECOMMENDATIONS: This patient's condition is appropriate for continued rehabilitative care in the following setting: CIR Patient has agreed to participate in recommended program. Potentially Note that insurance prior authorization may be required for reimbursement for recommended care.  Comment: This patient would be appropriate for inpatient rehab particularly given his size, difficulties with pain meds, etc. He could reach mod I goals in the time frame I suggested. Rehab Admissions Coordinator to follow up.  Thanks,  Ranelle OysterZachary T. Swartz, MD, [place] DomFAAPMR     04/20/2014

## 2014-04-21 NOTE — Progress Notes (Addendum)
Physical Therapy Treatment Patient Details Name: Derrick [name] MRN: [id number] DOB: [birth year] Today's Date: 04/21/2014    History of Present Illness s/p bilateral knee arthroplasties    PT Comments    Patient doing much better today.  Able to ambulate further and with less assistance.  Worked with patient on more normal gait pattern and answered questions related to equipment and discharge.  Agree with d/c plan to SNF to reach maximal potential for ultimate return to home.     Follow Up Recommendations  SNF     Equipment Recommendations  Rolling walker with 5" wheels;3in1 (PT)    Recommendations for Other Services       Precautions / Restrictions Precautions Precautions: Knee Precaution Comments: patient reports he does not need knee immobilizers Required Braces or Orthoses: Knee Immobilizer - Right;Knee Immobilizer - Left Restrictions RLE Weight Bearing: Weight bearing as tolerated LLE Weight Bearing: Weight bearing as tolerated    Mobility  Bed Mobility Overal bed mobility: Modified Independent Bed Mobility: Supine to Sit     Supine to sit: Modified independent (Device/Increase time)     General bed mobility comments: Patient relied on bed rail to A to bring trunk upright but able to control bilateral LE   Transfers Overall transfer level: Needs assistance Equipment used: Rolling walker (2 wheeled) Transfers: Sit to/from Stand Sit to Stand: Min assist         General transfer comment: required cueing for hand placement, assistance to stabilize walker  Ambulation/Gait Ambulation/Gait assistance: Min guard Ambulation Distance (Feet): 100 Feet Assistive device: Rolling walker (2 wheeled) Gait Pattern/deviations: Step-through pattern;Decreased stride length   Gait velocity interpretation: Below normal speed for age/gender General Gait Details: worked on Engineer, maintenance (IT) Rankin (Stroke Patients  Only)       Balance   Sitting-balance support: No upper extremity supported Sitting balance-Leahy Scale: Normal     Standing balance support: Bilateral upper extremity supported Standing balance-Leahy Scale: Poor                      Cognition Arousal/Alertness: Awake/alert Behavior During Therapy: WFL for tasks assessed/performed Overall Cognitive Status: Within Functional Limits for tasks assessed                      Exercises      General Comments        Pertinent Vitals/Pain Pain did not limit treatment.    Home Living                      Prior Function            PT Goals (current goals can now be found in the care plan section) Progress towards PT goals: Progressing toward goals;Goals met and updated - see care plan    Frequency  Min 6X/week    PT Plan Current plan remains appropriate    Co-evaluation             End of Session Equipment Utilized During Treatment: Gait belt Activity Tolerance: Patient tolerated treatment well Patient left: in chair;with call bell/phone within reach     Time: 1215-1305 PT Time Calculation (min): 50 min  Charges:  $Gait Training: 23-37 mins $Therapeutic Exercise: 8-22 mins  G CodesOlivia Canter, [place] [phone] 04/21/2014, 1:53 PM

## 2014-04-21 NOTE — Progress Notes (Signed)
Physical Therapy Treatment Patient Details Name: Derrick [name] MRN: [id number] DOB: Aug 11, 1956 Today's Date: 04/21/2014    History of Present Illness s/p bilateral knee arthroplasties    PT Comments    Good effort and performance this afternoon.  Increased room mobility with Min assist for transfers, and Min guard for ambulation, ROM visually 5-90 degrees with AAROM in sitting.  Needs Min assist for LE management getting into bed.  Patient will continue to benefit from PT services.  Follow Up Recommendations  SNF     Equipment Recommendations  Rolling walker with 5" wheels;3in1 (PT)    Recommendations for Other Services       Precautions / Restrictions Precautions Precautions: Knee Precaution Comments: patient reports he does not need knee immobilizers Required Braces or Orthoses: Knee Immobilizer - Right;Knee Immobilizer - Left Restrictions RLE Weight Bearing: Weight bearing as tolerated LLE Weight Bearing: Weight bearing as tolerated    Mobility  Bed Mobility Overal bed mobility: Needs Assistance Bed Mobility: Sit to Supine     Supine to sit: Modified independent (Device/Increase time) Sit to supine: Min assist   General bed mobility comments: Assist for LE management  Transfers Overall transfer level: Needs assistance Equipment used: Rolling walker (2 wheeled) Transfers: Sit to/from Stand Sit to Stand: Min guard;Min assist Stand pivot transfers: Min assist       General transfer comment: required cueing for hand placement, assistance to stabilize walker  Ambulation/Gait Ambulation/Gait assistance: Min guard Ambulation Distance (Feet): 40 Feet Assistive device: Rolling walker (2 wheeled) Gait Pattern/deviations: Step-through pattern;Decreased stride length   Gait velocity interpretation: Below normal speed for age/gender General Gait Details: worked on Engineer, maintenance (IT) Rankin (Stroke Patients  Only)       Balance   Sitting-balance support: No upper extremity supported Sitting balance-Leahy Scale: Good     Standing balance support: Bilateral upper extremity supported Standing balance-Leahy Scale: Poor                      Cognition Arousal/Alertness: Awake/alert Behavior During Therapy: WFL for tasks assessed/performed Overall Cognitive Status: Within Functional Limits for tasks assessed                      Exercises Total Joint Exercises Ankle Circles/Pumps: AROM;Both Short Arc Quad: AAROM;Both;10 reps;Seated Heel Slides: AAROM;Both;10 reps;Seated Goniometric ROM: visually 5-90 degrees with AAROM in sitting    General Comments        Pertinent Vitals/Pain 3-4/10 report by patient following pain meds    Home Living                      Prior Function            PT Goals (current goals can now be found in the care plan section) Acute Rehab PT Goals Patient Stated Goal: To walk and get better Progress towards PT goals: Progressing toward goals    Frequency  Min 6X/week    PT Plan Current plan remains appropriate    Co-evaluation             End of Session Equipment Utilized During Treatment: Gait belt Activity Tolerance: Patient tolerated treatment well;Patient limited by fatigue Patient left: in bed;with call bell/phone within reach     Time: 2440-1027 PT Time Calculation (min): 32 min  Charges:  $Gait Training: 23-37 mins $Therapeutic  Exercise: 8-22 mins $Therapeutic Activity: 8-22 mins                    G Codes:      Neola Worrall L Guilherme Schwenke 04/29/2014, 5:07 PM

## 2014-04-21 NOTE — Progress Notes (Signed)
Clinical Social Worker (CSW) met with patient to talk about a back up plan if patient can't go to [facility] Monday. [facility] offered a bed pending insurance authorization. Patient has no other bed offers at this point. Patient verbalized his understanding and reported that he is open to a back up plan. Weekday CSW will follow up.   Bailey Morgan, LCSWA Weekend CSW [phone]   

## 2014-04-21 NOTE — Progress Notes (Signed)
Orthopedic Tech Progress Note Patient Details:  Derrick [name] 1956-07-22 [id number] On cpm at 7:00 pm (B) LE increased 10 degrees to 0-70 Patient ID: Derrick [name], Derrick [name]   DOB: 1956-07-22, 58 y.o.   MRN: [id number]   [name] 04/21/2014, 7:03 PM

## 2014-04-21 NOTE — Progress Notes (Signed)
Pt. Has home cpap . Pt.states he can place on himself. RT informed pt. To notify if he needed any assistance.

## 2014-04-21 NOTE — Progress Notes (Signed)
PATIENT ID: Derrick [name]   5 Days Post-Op Procedure(s) (LRB): TOTAL KNEE BILATERAL (Bilateral)  Subjective: Doing well. No complaints. He has been working hard with physical therapy.  Objective:  Filed Vitals:   04/21/14 0645  BP: 118/63  Pulse: 99  Temp: 98.8 F (37.1 C)  Resp: 18     Bilateral lower extremity dressings clean dry and intact. No calf tenderness or swelling. Distally neurovascularly intact on both sides.  Labs:   Recent Labs  04/19/14 0707  HGB 9.3*   Recent Labs  04/19/14 0707  WBC 14.1*  RBC 3.02*  HCT 27.3*  PLT 270  No results found for this basename: NA, K, CL, CO2, BUN, CREATININE, GLUCOSE, CALCIUM,  in the last 72 hours  Assessment and Plan: Doing well after bilateral total knee arthroplasties, awaiting transition to skilled nursing Continue physical therapy while in the hospital. [place]  VTE proph: Aspirin and SCDs

## 2014-04-22 NOTE — Progress Notes (Signed)
Physical Therapy Treatment Patient Details Name: Derrick PaganiniDavid A [name] MRN: [id number] DOB: 1956-03-07 Today's Date: 04/22/2014    History of Present Illness s/p bilateral knee arthroplasties    PT Comments    Pt continues to make steady progress with mobility.   Follow Up Recommendations  SNF     Equipment Recommendations  Rolling walker with 5" wheels;3in1 (PT)    Precautions / Restrictions Precautions Precautions: Knee Precaution Comments: reinforced education of no pillow under knees Required Braces or Orthoses: Knee Immobilizer - Right;Knee Immobilizer - Left;Other Brace/Splint Other Brace/Splint: pt does not want to wear and does not with mobility. No buckling of either knee noted with gait. Restrictions RLE Weight Bearing: Weight bearing as tolerated LLE Weight Bearing: Weight bearing as tolerated    Mobility  Bed Mobility Overal bed mobility: Modified Independent Bed Mobility: Supine to Sit     Supine to sit: Modified independent (Device/Increase time)     General bed mobility comments: with bed elevated to approx 30 degrees and no rail used.  Transfers Overall transfer level: Needs assistance Equipment used: Rolling walker (2 wheeled) Transfers: Sit to/from Stand Sit to Stand: Min assist         General transfer comment: assist to stabilize walker only as pt places one hand on walker and pushes from seated surface with other hand.  Ambulation/Gait Ambulation/Gait assistance: Supervision Ambulation Distance (Feet): 200 Feet Assistive device: Rolling walker (2 wheeled) Gait Pattern/deviations: Step-through pattern;Decreased stride length Gait velocity: decreased Gait velocity interpretation: Below normal speed for age/gender General Gait Details: cues on posture and for natural flexion/extension of both knees with gait   Stairs Stairs: Yes Stairs assistance: Min guard Stair Management: Step to pattern;Two rails;Forwards Number of Stairs: 3 General stair  comments: cues on sequence (use stronger leg to go up first and weaker leg down first).      Cognition Arousal/Alertness: Awake/alert Behavior During Therapy: WFL for tasks assessed/performed Overall Cognitive Status: Within Functional Limits for tasks assessed             Exercises Total Joint Exercises Ankle Circles/Pumps: AROM;Both;10 reps;Supine Quad Sets: AROM;Strengthening;Both;10 reps;Supine Heel Slides: AROM;Strengthening;Both;10 reps;Supine Straight Leg Raises: AROM;Strengthening;Both;10 reps;Supine Goniometric ROM: supine gonimetric measurements: right knee: 60 degrees flexion. left knee: 75 degrees flexion.        PT Goals (current goals can now be found in the care plan section) Acute Rehab PT Goals Patient Stated Goal: To walk and get better PT Goal Formulation: With patient/family Time For Goal Achievement: 05/01/14 Potential to Achieve Goals: Good Progress towards PT goals: Progressing toward goals    Frequency  Min 6X/week    PT Plan Current plan remains appropriate    End of Session Equipment Utilized During Treatment: Gait belt Activity Tolerance: Patient tolerated treatment well Patient left: in chair;with family/visitor present;with call bell/phone within reach     Time: 1328-1402 PT Time Calculation (min): 34 min  Charges:  $Gait Training: 8-22 mins $Therapeutic Exercise: 8-22 mins                    G Codes:      Derrick KusterKathy Kyndle [name] 04/22/2014, 2:10 PM  Derrick KusterKathy Ryheem [name], PTA Office- [phone]

## 2014-04-22 NOTE — Progress Notes (Signed)
   PATIENT ID: Derrick [name]   6 Days Post-Op Procedure(s) (LRB): TOTAL KNEE BILATERAL (Bilateral)  Subjective: No complaints. Making excellent progress in physical therapy. Pain is minimal.  Objective:  Filed Vitals:   04/22/14 0523  BP: 103/50  Pulse: 92  Temp: 99.1 F (37.3 C)  Resp: 20     Both knees have intact dressings. Calves are soft nontender. Distally neurovascularly intact. He is able to do straight leg raise is now on both sides with slight lag.  Labs:  No results found for this basename: HGB,  in the last 72 hoursNo results found for this basename: WBC, RBC, HCT, PLT,  in the last 72 hoursNo results found for this basename: NA, K, CL, CO2, BUN, CREATININE, GLUCOSE, CALCIUM,  in the last 72 hours  Assessment and Plan: Doing well after bilateral knee replacement Continue physical therapy while in the hospital and transition to SNF tomorrow.  VTE proph: Aspirin and SCDs

## 2014-04-23 NOTE — Progress Notes (Addendum)
CSW received insurance authorization for patient to go to [facility] today. CSW will arrange EMS transportation for 2pm today. Patient aware and agreeable to plan. CSW set up transport for 2pm.  CSW received a phone call from [facility], stating that she is waiting to hear from [facility] this morning in order for patient to come to SNF. [facility] states they will inform social worker as soon as she hears from the insurance company.   Maree KrabbeLindsay Kaiyah Eber, MSW, Theresia MajorsLCSWA [phone]

## 2014-04-23 NOTE — Progress Notes (Addendum)
I have left a message for [employer] to discuss rehab venue options for this pt for today. I have discussed with RN CM and SW. [phone] I was notified by Crystal with [employer] that they are planning SNF for this pt today. [facility]. I have notified SW and RN CM. We will sign off. [phone]

## 2014-04-23 NOTE — Care Management Note (Signed)
CARE MANAGEMENT NOTE 04/23/2014  Patient:  Derrick [name],Derrick [name]   Account Number:  [id number]  Date Initiated:  04/16/2014  Documentation initiated by:  Vance PeperBRADY,Sharniece Gibbon  Subjective/Objective Assessment:   58 yr old male s/p bilateral knee arthroplasties.     Action/Plan:   Case manager spoke briefly with patient. [facility], but [facility]. Socail worker notified.  PT/OT to eval.   Anticipated DC Date:  04/23/2014   Anticipated DC Plan:  SKILLED NURSING FACILITY  In-house referral  Clinical Social Worker      DC Planning Services  CM consult      Choice offered to / List presented to:             [facility]  [facility]   Status of service:  Completed, signed off Medicare Important Message given?   (If response is "NO", the following Medicare IM given date fields will be blank) Date Medicare IM given:   Date Additional Medicare IM given:    Discharge Disposition:  SKILLED NURSING FACILITY  Per UR Regulation:    If discussed at Long Length of Stay Meetings, dates discussed:    Comments:  04/23/14 11:45am Vance PeperSusan Andrena Margerum, RN BSN Case Manager Social worker Mardella LaymanLindsey, notified case manager that authorization has been received from [employer]. Patient will discharge today.

## 2014-04-23 NOTE — Progress Notes (Signed)
PATIENT ID: Derrick PaganiniDavid A [name]  MRN: [id number]  DOB/AGE:  56-Nov-1957 / 56 y.o.  7 Days Post-Op Procedure(s) (LRB): TOTAL KNEE BILATERAL (Bilateral)    PROGRESS NOTE Subjective: Patient is alert, oriented, no Nausea, no Vomiting, yes passing gas, yes Bowel Movement. Taking PO well. Denies SOB, Chest or Calf Pain. Using Incentive Spirometer, PAS in place. Ambulate WBAT, CPM 0-70 Patient reports pain as mild  .    Objective: Vital signs in last 24 hours: Filed Vitals:   04/22/14 0523 04/22/14 1328 04/22/14 1940 04/23/14 0432  BP: 103/50 122/47 103/42 124/48  Pulse: 92 91 93 88  Temp: 99.1 F (37.3 C) 99.7 F (37.6 C) 98.2 F (36.8 C) 98.1 F (36.7 C)  TempSrc: Oral Oral Oral Oral  Resp: 20 18 16 18   Height:      Weight:      SpO2: 96% 100% 99% 100%      Intake/Output from previous day: I/O last 3 completed shifts: In: 240 [P.O.:240] Out: 1525 [Urine:1525]   Intake/Output this shift: Total I/O In: 360 [P.O.:360] Out: -    LABORATORY DATA: No results found for this basename: WBC, HGB, HCT, PLT, NA, K, CL, CO2, BUN, CREATININE, GLUCOSE, GLUCAP, PT, INR, CALCIUM, 2,  in the last 72 hours  Examination: Neurologically intact Neurovascular intact Sensation intact distally Intact pulses distally Dorsiflexion/Plantar flexion intact Incision: dressing C/D/I No cellulitis present Compartment soft}  Assessment:   7 Days Post-Op Procedure(s) (LRB): TOTAL KNEE BILATERAL (Bilateral) ADDITIONAL DIAGNOSIS:    Plan: PT/OT WBAT, CPM 5/hrs day until ROM 0-90 degrees, then D/C CPM DVT Prophylaxis:  SCDx72hrs, ASA 325 mg BID x 2 weeks DISCHARGE PLAN: Skilled Nursing Facility/Rehab DISCHARGE NEEDS: HHPT, HHRN, CPM, Walker and 3-in-1 comode seat     Derrick [name] 04/23/2014, 1:50 PM

## 2014-04-24 ENCOUNTER — Encounter: Payer: Self-pay | Admitting: Adult Health

## 2014-04-24 ENCOUNTER — Other Ambulatory Visit: Payer: Self-pay | Admitting: *Deleted

## 2014-04-24 ENCOUNTER — Encounter: Payer: Self-pay | Admitting: *Deleted

## 2014-04-24 ENCOUNTER — Non-Acute Institutional Stay (SKILLED_NURSING_FACILITY): Payer: BC Managed Care – PPO | Admitting: Adult Health

## 2014-04-24 DIAGNOSIS — Z96659 Presence of unspecified artificial knee joint: Secondary | ICD-10-CM

## 2014-04-24 DIAGNOSIS — M25569 Pain in unspecified knee: Secondary | ICD-10-CM

## 2014-04-24 DIAGNOSIS — M170 Bilateral primary osteoarthritis of knee: Secondary | ICD-10-CM

## 2014-04-24 DIAGNOSIS — Z96653 Presence of artificial knee joint, bilateral: Secondary | ICD-10-CM

## 2014-04-24 DIAGNOSIS — E785 Hyperlipidemia, unspecified: Secondary | ICD-10-CM

## 2014-04-24 DIAGNOSIS — M1710 Unilateral primary osteoarthritis, unspecified knee: Secondary | ICD-10-CM

## 2014-04-24 DIAGNOSIS — M25562 Pain in left knee: Secondary | ICD-10-CM

## 2014-04-24 DIAGNOSIS — K5900 Constipation, unspecified: Secondary | ICD-10-CM

## 2014-04-24 DIAGNOSIS — IMO0002 Reserved for concepts with insufficient information to code with codable children: Secondary | ICD-10-CM

## 2014-04-24 DIAGNOSIS — D62 Acute posthemorrhagic anemia: Secondary | ICD-10-CM

## 2014-04-24 DIAGNOSIS — M25561 Pain in right knee: Secondary | ICD-10-CM

## 2014-04-24 DIAGNOSIS — I10 Essential (primary) hypertension: Secondary | ICD-10-CM

## 2014-04-24 MED ORDER — OXYCODONE-ACETAMINOPHEN 5-325 MG PO TABS: ORAL_TABLET | ORAL | Status: DC

## 2014-04-24 MED ORDER — TRAMADOL HCL 50 MG PO TABS: ORAL_TABLET | ORAL | Status: AC

## 2014-04-24 NOTE — Telephone Encounter (Signed)
[facility] 

## 2014-04-26 ENCOUNTER — Non-Acute Institutional Stay (SKILLED_NURSING_FACILITY): Payer: BC Managed Care – PPO | Admitting: Internal Medicine

## 2014-04-26 ENCOUNTER — Encounter: Payer: Self-pay | Admitting: Adult Health

## 2014-04-26 DIAGNOSIS — IMO0002 Reserved for concepts with insufficient information to code with codable children: Secondary | ICD-10-CM

## 2014-04-26 DIAGNOSIS — M170 Bilateral primary osteoarthritis of knee: Secondary | ICD-10-CM

## 2014-04-26 DIAGNOSIS — I10 Essential (primary) hypertension: Secondary | ICD-10-CM | POA: Insufficient documentation

## 2014-04-26 DIAGNOSIS — K5900 Constipation, unspecified: Secondary | ICD-10-CM | POA: Insufficient documentation

## 2014-04-26 DIAGNOSIS — D62 Acute posthemorrhagic anemia: Secondary | ICD-10-CM | POA: Insufficient documentation

## 2014-04-26 DIAGNOSIS — M25562 Pain in left knee: Secondary | ICD-10-CM

## 2014-04-26 DIAGNOSIS — M25561 Pain in right knee: Secondary | ICD-10-CM | POA: Insufficient documentation

## 2014-04-26 DIAGNOSIS — E785 Hyperlipidemia, unspecified: Secondary | ICD-10-CM | POA: Insufficient documentation

## 2014-04-26 DIAGNOSIS — M1710 Unilateral primary osteoarthritis, unspecified knee: Secondary | ICD-10-CM

## 2014-04-26 NOTE — Progress Notes (Signed)
Patient ID: Derrick [name], male   DOB: 11-08-1956, 58 y.o.   MRN: [id number]               PROGRESS NOTE  DATE: 04/24/2014  FACILITY: Nursing Home [facility]  LEVEL OF CARE: SNF (31)  Acute Visit  CHIEF COMPLAINT:  Follow-up Hospitalization  HISTORY OF PRESENT ILLNESS: This is a 58 year old male who [facility] with Arthritis S/P Bilateral total knee arthroplasties. He has been admitted for a short-term rehabilitation.  REASSESSMENT OF ONGOING PROBLEM(S):  HTN: Pt 's HTN remains stable.  Denies CP, sob, DOE, pedal edema, headaches, dizziness or visual disturbances.  No complications from the medications currently being used.  Last BP : 113/60  ANEMIA: The anemia has been stable. The patient denies fatigue, melena or hematochezia. No complications from the medications currently being used. 4/15 hgb 9.3  HYPERLIPIDEMIA: No complications from the medications presently being used.   PAST MEDICAL HISTORY : Reviewed.  No changes/see problem list  CURRENT MEDICATIONS: Reviewed per MAR/see medication list  REVIEW OF SYSTEMS:  GENERAL: no change in appetite, no fatigue, no weight changes, no fever, chills or weakness RESPIRATORY: no cough, SOB, DOE, wheezing, hemoptysis CARDIAC: no chest pain, edema or palpitations GI: no abdominal pain, diarrhea, heart burn, nausea or vomiting, +constipation EXTREMITIES: complains of pain on bilateral surgical knees, 7/10 (pain scale)  PHYSICAL EXAMINATION  GENERAL: no acute distress, morbidly obese EYES: conjunctivae normal, sclerae normal, normal eye lids NECK: supple, trachea midline, no neck masses, no thyroid tenderness, no thyromegaly LYMPHATICS: no LAN in the neck, no supraclavicular LAN RESPIRATORY: breathing is even & unlabored, BS CTAB CARDIAC: RRR, no murmur,no extra heart sounds, no edema GI: abdomen soft, normal BS, no masses, no tenderness, no  hepatomegaly, no splenomegaly EXTREMITIES: able to move all 4 extremities with limited ROM on BLE due to surgical pain PSYCHIATRIC: the patient is alert & oriented to person, affect & behavior appropriate  LABS/RADIOLOGY: Labs reviewed: Basic Metabolic Panel:  Recent Labs  56/38/7504/17/15 1008  NA 140  K 4.7  CL 102  CO2 24  GLUCOSE 106*  BUN 22  CREATININE 1.10  CALCIUM 9.9   CBC:  Recent Labs  04/06/14 1008 04/17/14 0609 04/18/14 0420 04/19/14 0707  WBC 8.2 13.1* 18.0* 14.1*  NEUTROABS 5.1  --   --   --   HGB 15.9 11.3* 10.0* 9.3*  HCT 44.4 33.1* 29.1* 27.3*  MCV 89.2 90.2 88.4 90.4  PLT 263 275 230 270    ASSESSMENT/PLAN:  Arthritis is status post bilateral total knee arthroplasty - for rehabilitation Hypertension - well controlled; continue lisinopril Hyperlipidemia - continue Zocor Bilateral knee pain - start Ultram 50 mg one tablet by mouth twice a day and Percocet 5/325 mg 1-2 tabs by mouth every 3 hours when necessary Constipation - start Colace 100 mg by mouth twice a day and MiraLax 17 g last 6-8 ounces of liquid by mouth daily Anemia, acute blood loss - stable; check CBC   CPT CODE: [id number]  Ella BodoMonina Vargas - NP [facility] [phone]

## 2014-04-27 NOTE — Progress Notes (Signed)
HISTORY & PHYSICAL  DATE: 04/26/2014   FACILITY: [facility]  LEVEL OF CARE: SNF (31)  ALLERGIES:  No Known Allergies  CHIEF COMPLAINT:  Manage bilateral knee osteoarthritis, hypertension and hyperlipidemia  HISTORY OF PRESENT ILLNESS: Patient is a 58 year old Caucasian male.  KNEE OSTEOARTHRITIS: Patient had a history of pain and functional disability in the bilateral knees due to end-stage osteoarthritis and has failed nonsurgical conservative treatments. Patient had worsening of pain with activity and weight bearing, pain that interfered with activities of daily living & pain with passive range of motion. Therefore patient underwent bilateral total knee arthroplasties and tolerated the procedure well. Patient is admitted to this facility for sort short-term rehabilitation. Patient denies knee pain.  HTN: Pt 's HTN remains stable.  Denies CP, sob, DOE, headaches, dizziness or visual disturbances.  No complications from the medications currently being used.  Last BP : 147/81.  HYPERLIPIDEMIA: No complications from the medications presently being used. Last fasting lipid panel not available.  PAST MEDICAL HISTORY :  Past Medical History  Diagnosis Date  . Hypertension   . Arthritis   . Sleep apnea     cpap       last study 99  . Obese     PAST SURGICAL HISTORY: Past Surgical History  Procedure Laterality Date  . Back surgery    . Hernia repair    . Total knee arthroplasty Bilateral 04/16/2014    Procedure: TOTAL KNEE BILATERAL;  Surgeon: Nestor LewandowskyFrank J Rowan, MD;  Location: [facility] OR;  Service: Orthopedics;  Laterality: Bilateral;  Bilateral nerve blocks; General anesthesia    SOCIAL HISTORY:  reports that he has never smoked. He has never used smokeless tobacco. He reports that he drinks alcohol. He reports that he does not use illicit drugs.  FAMILY HISTORY: None  CURRENT MEDICATIONS: Reviewed per MAR/see medication list  REVIEW OF SYSTEMS:  See  HPI otherwise 14 point ROS is negative.  PHYSICAL EXAMINATION  VS:  See VS section  GENERAL: no acute distress, morbidly obese body habitus EYES: conjunctivae normal, sclerae normal, normal eye lids MOUTH/THROAT: lips without lesions,no lesions in the mouth,tongue is without lesions,uvula elevates in midline NECK: supple, trachea midline, no neck masses, no thyroid tenderness, no thyromegaly LYMPHATICS: no LAN in the neck, no supraclavicular LAN RESPIRATORY: breathing is even & unlabored, BS CTAB CARDIAC: RRR, no murmur,no extra heart sounds, +3 bilateral lower extremity edema GI:  ABDOMEN: abdomen soft, normal BS, no masses, no tenderness  LIVER/SPLEEN: no hepatomegaly, no splenomegaly MUSCULOSKELETAL: HEAD: normal to inspection & palpation BACK: no kyphosis, scoliosis or spinal processes tenderness EXTREMITIES: LEFT UPPER EXTREMITY: full range of motion, normal strength & tone RIGHT UPPER EXTREMITY:  full range of motion, normal strength & tone LEFT LOWER EXTREMITY:   range of motion not tested due to surgery, normal strength & tone RIGHT LOWER EXTREMITY:   range of motion not tested due to surgery, normal strength & tone PSYCHIATRIC: the patient is alert & oriented to person, affect & behavior appropriate  LABS/RADIOLOGY:  Labs reviewed: Basic Metabolic Panel:  Recent Labs  14/78/2904/17/15 1008  NA 140  K 4.7  CL 102  CO2 24  GLUCOSE 106*  BUN 22  CREATININE 1.10  CALCIUM 9.9   CBC:  Recent Labs  04/06/14 1008 04/17/14 0609 04/18/14 0420 04/19/14 0707  WBC 8.2 13.1* 18.0* 14.1*  NEUTROABS 5.1  --   --   --   HGB 15.9 11.3* 10.0*  9.3*  HCT 44.4 33.1* 29.1* 27.3*  MCV 89.2 90.2 88.4 90.4  PLT 263 275 230 270    CHEST  2 VIEW   COMPARISON:  04/04/2007   FINDINGS: The heart size and mediastinal contours are within normal limits. Both lungs are clear. The visualized skeletal structures are unremarkable.   IMPRESSION: No active cardiopulmonary disease.     ASSESSMENT/PLAN:  Bilateral knee osteoarthritis-status post arthroplasties. Continue rehabilitation. Hypertension-blood pressure borderline. Will monitor. Hyperlipidemia-continue statin. Acute blood loss anemia-recheck Obstructive sleep apnea-on CPAP Check CBC  I have reviewed patient's medical records received at admission/from hospitalization.  CPT CODE: [id number]  Angela CoxGayani Y Dasanayaka, MD [facility] [phone]

## 2014-05-02 ENCOUNTER — Non-Acute Institutional Stay (SKILLED_NURSING_FACILITY): Payer: BC Managed Care – PPO | Admitting: Adult Health

## 2014-05-02 DIAGNOSIS — I10 Essential (primary) hypertension: Secondary | ICD-10-CM

## 2014-05-02 DIAGNOSIS — E785 Hyperlipidemia, unspecified: Secondary | ICD-10-CM

## 2014-05-02 DIAGNOSIS — Z96653 Presence of artificial knee joint, bilateral: Secondary | ICD-10-CM

## 2014-05-02 DIAGNOSIS — D62 Acute posthemorrhagic anemia: Secondary | ICD-10-CM

## 2014-05-02 DIAGNOSIS — IMO0002 Reserved for concepts with insufficient information to code with codable children: Secondary | ICD-10-CM

## 2014-05-02 DIAGNOSIS — Z96659 Presence of unspecified artificial knee joint: Secondary | ICD-10-CM

## 2014-05-02 DIAGNOSIS — M170 Bilateral primary osteoarthritis of knee: Secondary | ICD-10-CM

## 2014-05-02 DIAGNOSIS — K5900 Constipation, unspecified: Secondary | ICD-10-CM

## 2014-05-02 DIAGNOSIS — M1710 Unilateral primary osteoarthritis, unspecified knee: Secondary | ICD-10-CM

## 2014-05-24 ENCOUNTER — Encounter: Payer: Self-pay | Admitting: Adult Health

## 2014-05-24 NOTE — Progress Notes (Signed)
Patient ID: Derrick [name], male   DOB: 1956-05-20, 58 y.o.   MRN: [id number]                 PROGRESS NOTE  DATE: 05/02/14  FACILITY: Nursing Home Location: [facility]  LEVEL OF CARE: SNF (31)  Acute Visit  CHIEF COMPLAINT:  Discharge Notes  HISTORY OF PRESENT ILLNESS: This is a 58 year old male who is for discharge home with outpatient PT. He has been admitted to [facility] on 04/23/14 from [facility] with Arthritis S/P Bilateral total knee arthroplasties. Patient was admitted to this facility for short-term rehabilitation after the patient's recent hospitalization.  Patient has completed SNF rehabilitation and therapy has cleared the patient for discharge.   REASSESSMENT OF ONGOING PROBLEM(S):  HTN: Pt 's HTN remains stable.  Denies CP, sob, DOE, pedal edema, headaches, dizziness or visual disturbances.  No complications from the medications currently being used.  Last BP : 125/86  ANEMIA: The anemia has been stable. The patient denies fatigue, melena or hematochezia. No complications from the medications currently being used. 5/15 hgb 9.3  CONSTIPATION: The constipation remains stable. No complications from the medications presently being used. Patient denies ongoing constipation, abdominal pain, nausea or vomiting.   PAST MEDICAL HISTORY : Reviewed.  No changes/see problem list  CURRENT MEDICATIONS: Reviewed per MAR/see medication list  REVIEW OF SYSTEMS:  GENERAL: no change in appetite, no fatigue, no weight changes, no fever, chills or weakness RESPIRATORY: no cough, SOB, DOE, wheezing, hemoptysis CARDIAC: no chest pain, edema or palpitations GI: no abdominal pain, diarrhea, heart burn, nausea or vomiting,   PHYSICAL EXAMINATION  GENERAL: no acute distress, morbidly obese NECK: supple, trachea midline, no neck masses, no thyroid tenderness, no thyromegaly LYMPHATICS: no LAN in the neck, no supraclavicular LAN RESPIRATORY: breathing is even &  unlabored, BS CTAB CARDIAC: RRR, no murmur,no extra heart sounds, no edema GI: abdomen soft, normal BS, no masses, no tenderness, no hepatomegaly, no splenomegaly EXTREMITIES: able to move all 4 extremities  PSYCHIATRIC: the patient is alert & oriented to person, affect & behavior appropriate  LABS/RADIOLOGY: 04/25/14 WBC 10.4 hemoglobin 9.3 hematocrit 31.4 Labs reviewed: Basic Metabolic Panel:  Recent Labs  56/31/49 1008  NA 140  K 4.7  CL 102  CO2 24  GLUCOSE 106*  BUN 22  CREATININE 1.10  CALCIUM 9.9   CBC:  Recent Labs  04/06/14 1008 04/17/14 0609 04/18/14 0420 04/19/14 0707  WBC 8.2 13.1* 18.0* 14.1*  NEUTROABS 5.1  --   --   --   HGB 15.9 11.3* 10.0* 9.3*  HCT 44.4 33.1* 29.1* 27.3*  MCV 89.2 90.2 88.4 90.4  PLT 263 275 230 270    ASSESSMENT/PLAN:  Arthritis is status post bilateral total knee arthroplasty - for outpatient PT Hypertension - well controlled; continue lisinopril Hyperlipidemia - continue Zocor Constipation - stable; continue Colace and Miralax Anemia, acute blood loss - stable   I have filled out patient's discharge paperwork and written prescriptions.  Patient will have PT outpatient care.  Total discharge time: Less than 30 minutes Discharge time involved coordination of the discharge process with Child psychotherapist, nursing staff and therapy department.    CPT CODE: 70263    Ella Bodo - NP [facility] [phone]

## 2015-06-13 ENCOUNTER — Other Ambulatory Visit: Payer: Self-pay | Admitting: Surgery

## 2015-07-02 ENCOUNTER — Encounter: Payer: Self-pay | Admitting: Dietician

## 2015-07-02 ENCOUNTER — Encounter: Payer: Self-pay | Attending: Surgery | Admitting: Dietician

## 2015-07-02 DIAGNOSIS — Z713 Dietary counseling and surveillance: Secondary | ICD-10-CM | POA: Insufficient documentation

## 2015-07-02 DIAGNOSIS — Z6841 Body Mass Index (BMI) 40.0 and over, adult: Secondary | ICD-10-CM | POA: Insufficient documentation

## 2015-07-02 NOTE — Progress Notes (Signed)
  Pre-Op Assessment Visit:  Pre-Operative RYGB Surgery  Medical Nutrition Therapy:  Appt start time: 1735   End time:  1820.  Patient was seen on 07/02/2015  for Pre-Operative Nutrition Assessment. Assessment and letter of approval [facility]ed to [facility] coordinator on 07/02/2015.   Preferred Learning Style:   No preference indicated   Learning Readiness:   Ready  Handouts given during visit include:  Pre-Op Goals Bariatric Surgery Protein Shakes   During the appointment today the following Pre-Op Goals were reviewed with the patient: Maintain or lose weight as instructed by your surgeon Make healthy food choices Begin to limit portion sizes Limited concentrated sugars and fried foods Keep fat/sugar in the single digits per serving on   food labels Practice CHEWING your food  (aim for 30 chews per bite or until applesauce consistency) Practice not drinking 15 minutes before, during, and 30 minutes after each meal/snack Avoid all carbonated beverages  Avoid/limit caffeinated beverages  Avoid all sugar-sweetened beverages Consume 3 meals per day; eat every 3-5 hours Make a list of non-food related activities Aim for 64-100 ounces of FLUID daily  Aim for at least 60-80 grams of PROTEIN daily Look for a liquid protein source that contain ?15 g protein and ?5 g carbohydrate  (ex: shakes, drinks, shots)  Patient-Centered Goals: Goals: increase activity level/walking, not lose too much muscle mass   10 level of confidence/10 level of importance  Demonstrated degree of understanding via:  Teach Back  Teaching Method Utilized:  Visual Auditory Hands on  Barriers to learning/adherence to lifestyle change: none  Patient to call the [facility] to enroll in Pre-Op and Post-Op Nutrition Education when surgery date is scheduled.

## 2015-07-02 NOTE — Patient Instructions (Signed)

## 2015-07-04 ENCOUNTER — Other Ambulatory Visit (HOSPITAL_COMMUNITY): Payer: Self-pay

## 2015-07-04 ENCOUNTER — Ambulatory Visit (HOSPITAL_COMMUNITY): Payer: Self-pay

## 2015-07-22 ENCOUNTER — Ambulatory Visit: Payer: Self-pay | Admitting: Dietician

## 2015-10-02 ENCOUNTER — Encounter: Payer: BLUE CROSS/BLUE SHIELD | Attending: Surgery | Admitting: Dietician

## 2015-10-02 ENCOUNTER — Encounter: Payer: Self-pay | Admitting: Dietician

## 2015-10-02 DIAGNOSIS — Z6841 Body Mass Index (BMI) 40.0 and over, adult: Secondary | ICD-10-CM | POA: Insufficient documentation

## 2015-10-02 DIAGNOSIS — Z713 Dietary counseling and surveillance: Secondary | ICD-10-CM | POA: Insufficient documentation

## 2015-10-02 NOTE — Patient Instructions (Addendum)
Start working on cutting back on caffeine (coffee) and carbonation (diet ginger ale). Continue exercising at least 10 minutes per day and increase when you can. (bike and weights) Aim to get in 64 oz of fluid each day. Have a water bottle next too you throughout the day.  Make sure flavored packets are sugar free (less than 10 calories per serving). Start working on chewing 20-30 chews per bite.  Start working on not drinking 15 minutes before, during, and wait until 30 minutes after meal before drinking.

## 2015-10-02 NOTE — Progress Notes (Signed)
  Supervised Weight Loss Visit:   Pre-Operative RYGG Surgery  Medical Nutrition Therapy:  Appt start time: 0830 end time:  0845.  Primary concerns today: Supervised Weight Loss Visit. Derrick [name] is here today for one supervised weight loss visit. Returns with a 2 lb weight gain since July. Has been cutting back on portion and carbs. Started exercising for the past 2 month - riding recumbent bike 10 minutes each morning. Has noticed a decrease in heart rate with exercise. Has cut diet drinks down to 1 x day (diet ginger ale). Has black coffee a few times per day. Also drinks Gatorade. Has tried [employer] Protein shakes.   Has not started working on chewing 20-30 x bite or not drinking during meals yet.   Weight: 336.6 BMI: 47.0  Preferred Learning Style:   No preference indicated   Learning Readiness:   Ready   Nutritional Diagnosis:  [place]-3.3 Obesity related to past poor dietary habits and physical inactivity as evidenced by patient attending supervised weight loss for insurance approval of bariatric surgery.    Intervention:  Nutrition counseling provided. Plan: Start working on cutting back on caffeine (coffee) and carbonation (diet ginger ale). Continue exercising at least 10 minutes per day and increase when you can. (bike and weights) Aim to get in 64 oz of fluid each day. Have a water bottle next too you throughout the day.  Make sure flavored packets are sugar free (less than 10 calories per serving). Start working on chewing 20-30 chews per bite.  Start working on not drinking 15 minutes before, during, and wait until 30 minutes after meal before drinking.  Teaching Method Utilized:  Visual Auditory Hands on  Barriers to learning/adherence to lifestyle change: none  Demonstrated degree of understanding via:  Teach Back   Monitoring/Evaluation:  Dietary intake, exercise, and body weight. Follow up to attend Pre-Op class.

## 2016-01-06 ENCOUNTER — Other Ambulatory Visit: Payer: Self-pay | Admitting: Surgery

## 2016-01-20 ENCOUNTER — Other Ambulatory Visit: Payer: Self-pay | Admitting: Surgery

## 2016-01-23 ENCOUNTER — Other Ambulatory Visit: Payer: Self-pay

## 2016-01-23 ENCOUNTER — Other Ambulatory Visit (HOSPITAL_COMMUNITY): Payer: Self-pay | Admitting: *Deleted

## 2016-01-23 ENCOUNTER — Ambulatory Visit (HOSPITAL_COMMUNITY)
Admission: RE | Admit: 2016-01-23 | Discharge: 2016-01-23 | Disposition: A | Discharge status: Home / Self Care | Payer: BLUE CROSS/BLUE SHIELD | Source: Ambulatory Visit | Attending: Surgery | Admitting: Surgery

## 2016-01-23 DIAGNOSIS — K224 Dyskinesia of esophagus: Secondary | ICD-10-CM | POA: Insufficient documentation

## 2016-01-23 DIAGNOSIS — Z01818 Encounter for other preprocedural examination: Secondary | ICD-10-CM | POA: Insufficient documentation

## 2016-02-13 ENCOUNTER — Ambulatory Visit (INDEPENDENT_AMBULATORY_CARE_PROVIDER_SITE_OTHER): Payer: BLUE CROSS/BLUE SHIELD | Admitting: Psychiatry

## 2016-02-18 ENCOUNTER — Ambulatory Visit (INDEPENDENT_AMBULATORY_CARE_PROVIDER_SITE_OTHER): Payer: BLUE CROSS/BLUE SHIELD | Admitting: Psychiatry

## 2016-03-25 ENCOUNTER — Ambulatory Visit: Payer: Self-pay | Admitting: Surgery

## 2016-03-30 ENCOUNTER — Encounter: Payer: BLUE CROSS/BLUE SHIELD | Attending: Surgery

## 2016-03-30 DIAGNOSIS — Z029 Encounter for administrative examinations, unspecified: Secondary | ICD-10-CM | POA: Insufficient documentation

## 2016-03-30 NOTE — Progress Notes (Signed)
  Pre-Operative Nutrition Class:  Appt start time: 3887   End time:  1830.  Patient was seen on 03/30/2016 for Pre-Operative Bariatric Surgery Education at the [facility].   Surgery date: 04/20/2016 Surgery type: RYGB Start weight at [facility]: 334.5 lbs on 07/02/2015 Weight today: 336 lbs  TANITA  BODY COMP RESULTS  03/30/16   BMI (kg/m^2) 46.9   Fat Mass (lbs) 171   Fat Free Mass (lbs) 165   Total Body Water (lbs) 121   Samples given per MNT protocol. Patient educated on appropriate usage: Premier protein shake (vanilla - qty 1) Lot #: [id number] Exp: 11/2016  Bariatric Advantage Calcium Citrate chew (tropical orange - qty 1) Lot #: [id number] Exp: 07/2016  Celebrate Vitamins Multivitamin (pineapple strawberry - qty 1) Lot #: [id number] Exp: 11/2016  Renee Pain Protein Powder (unflavored - qty 1) Lot #: [id number] Exp: 06/2017  The following the learning objectives were met by the patient during this course:  Identify Pre-Op Dietary Goals and will begin 2 weeks pre-operatively  Identify appropriate sources of fluids and proteins   State protein recommendations and appropriate sources pre and post-operatively  Identify Post-Operative Dietary Goals and will follow for 2 weeks post-operatively  Identify appropriate multivitamin and calcium sources  Describe the need for physical activity post-operatively and will follow MD recommendations  State when to call healthcare provider regarding medication questions or post-operative complications  Handouts given during class include:  Pre-Op Bariatric Surgery Diet Handout  Protein Shake Handout  Post-Op Bariatric Surgery Nutrition Handout  BELT Program Information Flyer  Support Group Information Flyer  [facility] Bariatric Supplements Price List  Follow-Up Plan: Patient will follow-up at [facility] 2 weeks post operatively for diet advancement per MD.

## 2016-04-09 NOTE — Patient Instructions (Addendum)
Derrick PaganiniDavid A [name]  04/09/2016   Your procedure is scheduled on: 04-20-16  Report to [facility]  Entrance take [facility]  elevators to 3rd floor to  Short Stay Center at 515 AM.  Call this number if you have problems the morning of surgery [phone]   Remember: ONLY 1 PERSON MAY GO WITH YOU TO SHORT STAY TO GET  READY MORNING OF YOUR SURGERY.  Do not eat food or drink liquids :After Midnight.  BRING CPAP MASK AND TUBING   Take these medicines the morning of surgery with A SIP OF WATER: flexeril, claritin, tylenol             You may not have any metal on your body including hair pins and              piercings  Do not wear jewelry, make-up, lotions, powders or perfumes, deodorant             Do not wear nail polish.  Do not shave  48 hours prior to surgery.              Men may shave face and neck.   Do not bring valuables to the hospital. [facility] IS NOT             RESPONSIBLE   FOR VALUABLES.  Contacts, dentures or bridgework may not be worn into surgery.  Leave suitcase in the car. After surgery it may be brought to your room.                  Please read over the following fact sheets you were given: _____________________________________________________________________             [facility] - Pre[facility] Before surgery, you can play an important role.  Because skin is not sterile, your skin needs to be as free of germs as possible.  You can reduce the number of germs on your skin by washing with CHG (chlorahexidine gluconate) soap before surgery.  CHG is an antiseptic cleaner which kills germs and bonds with the skin to continue killing germs even after washing. Please DO NOT use if you have an allergy to CHG or antibacterial soaps.  If your skin becomes reddened/irritated stop using the CHG and inform your nurse when you arrive at Short Stay. Do not shave (including legs and underarms) for at least 48 hours prior to the first CHG shower.  You  may shave your face/neck. Please follow these instructions carefully:  1.  Shower with CHG Soap the night before surgery and the  morning of Surgery.  2.  If you choose to wash your hair, wash your hair first as usual with your  normal  shampoo.  3.  After you shampoo, rinse your hair and body thoroughly to remove the  shampoo.                           4.  Use CHG as you would any other liquid soap.  You can apply chg directly  to the skin and wash                       Gently with a scrungie or clean washcloth.  5.  Apply the CHG Soap to your body ONLY FROM THE NECK DOWN.   Do not use  on face/ open                           Wound or open sores. Avoid contact with eyes, ears mouth and genitals (private parts).                       Wash face,  Genitals (private parts) with your normal soap.             6.  Wash thoroughly, paying special attention to the area where your surgery  will be performed.  7.  Thoroughly rinse your body with warm water from the neck down.  8.  DO NOT shower/wash with your normal soap after using and rinsing off  the CHG Soap.                9.  Pat yourself dry with a clean towel.            10.  Wear clean pajamas.            11.  Place clean sheets on your bed the night of your first shower and do not  sleep with pets. Day of Surgery : Do not apply any lotions/deodorants the morning of surgery.  Please wear clean clothes to the hospital/surgery center.  FAILURE TO FOLLOW THESE INSTRUCTIONS MAY RESULT IN THE CANCELLATION OF YOUR SURGERY PATIENT SIGNATURE_________________________________  NURSE SIGNATURE__________________________________  ________________________________________________________________________

## 2016-04-09 NOTE — Progress Notes (Signed)
ekgm 01-23-16 epic

## 2016-04-15 ENCOUNTER — Encounter (INDEPENDENT_AMBULATORY_CARE_PROVIDER_SITE_OTHER): Payer: Self-pay

## 2016-04-15 ENCOUNTER — Encounter (HOSPITAL_COMMUNITY)
Admission: RE | Admit: 2016-04-15 | Discharge: 2016-04-15 | Disposition: A | Discharge status: Home / Self Care | Payer: BLUE CROSS/BLUE SHIELD | Source: Ambulatory Visit | Attending: Surgery | Admitting: Surgery

## 2016-04-15 ENCOUNTER — Encounter (HOSPITAL_COMMUNITY): Payer: Self-pay

## 2016-04-15 DIAGNOSIS — Z01812 Encounter for preprocedural laboratory examination: Secondary | ICD-10-CM | POA: Diagnosis not present

## 2016-04-15 HISTORY — DX: Umbilical hernia without obstruction or gangrene: K42.9

## 2016-04-15 HISTORY — DX: Spinal stenosis, site unspecified: M48.00

## 2016-04-15 LAB — COMPREHENSIVE METABOLIC PANEL
ALBUMIN: 4.6 g/dL (ref 3.5–5.0)
ALT: 42 U/L (ref 17–63)
ANION GAP: 9 (ref 5–15)
AST: 31 U/L (ref 15–41)
Alkaline Phosphatase: 92 U/L (ref 38–126)
BUN: 22 mg/dL — ABNORMAL HIGH (ref 6–20)
CHLORIDE: 103 mmol/L (ref 101–111)
CO2: 26 mmol/L (ref 22–32)
CREATININE: 1.13 mg/dL (ref 0.61–1.24)
Calcium: 9.7 mg/dL (ref 8.9–10.3)
GFR calc non Af Amer: >60 mL/min (ref >60)
Glucose, Bld: 113 mg/dL — ABNORMAL HIGH (ref 65–99)
POTASSIUM: 5 mmol/L (ref 3.5–5.1)
SODIUM: 138 mmol/L (ref 135–145)
Total Bilirubin: 0.7 mg/dL (ref 0.3–1.2)
Total Protein: 7.7 g/dL (ref 6.5–8.1)

## 2016-04-15 LAB — CBC WITH DIFFERENTIAL/PLATELET
BASOS PCT: 1 %
Basophils Absolute: 0 10*3/uL (ref 0.0–0.1)
EOS ABS: 0.4 10*3/uL (ref 0.0–0.7)
EOS PCT: 6 %
HCT: 41.6 % (ref 39.0–52.0)
Hemoglobin: 14.7 g/dL (ref 13.0–17.0)
Lymphocytes Relative: 26 %
Lymphs Abs: 1.6 10*3/uL (ref 0.7–4.0)
MCH: 30.6 pg (ref 26.0–34.0)
MCHC: 35.3 g/dL (ref 30.0–36.0)
MCV: 86.7 fL (ref 78.0–100.0)
MONO ABS: 0.5 10*3/uL (ref 0.1–1.0)
MONOS PCT: 7 %
Neutro Abs: 3.7 10*3/uL (ref 1.7–7.7)
Neutrophils Relative %: 60 %
Platelets: 250 10*3/uL (ref 150–400)
RBC: 4.8 MIL/uL (ref 4.22–5.81)
RDW: 13.4 % (ref 11.5–15.5)
WBC: 6.2 10*3/uL (ref 4.0–10.5)

## 2016-04-19 NOTE — H&P (Signed)
Derrick [name] Location: [facility] Patient #: [id number] DOB: 1956/05/21 Married / Language: English / Race: White Male  History of Present Illness:  The patient is a 60 year old male who presents for a bariatric surgery evaluation.   The patient is referred by Dr. Leeroy Bock, who is his PCP.  He comes by himself. [Note: I took care of his wife for RYGB]   In good spirits and ready for surgery. He had no real questions and understands the opeation well. I reviewed the x-rays, consults, and plans for surgery. Unfortunately, he really has not budged his weight since I last saw him.   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 He was told this at the time of exam. He's never had a prior upper endoscopy. He's had minimal reflux is usually attributed to over eating. He complains of no indigestion. Occasionally some solid foods like chicken will hang up is in esophagus, but he has attributed this to taking to larger bites. We talked about upper endoscopy before surgery. At this point with minimal to no symptoms, he is not interested in endoscopy prior to surgery.  [place] on 01/23/2016 - negative  He saw Enzo Bi for psych - 02/13/2016  History of weight issues: His initial weight is 330, BMI - 46.1 He is here for weight loss surgery. I did his wife's surgery. He is interested in a gastric bypass. He has been to the information session with his wife. He also went to online information session. He is interested in the gastric bypass. He has tried [employer] twice. He's lost as much as 50 pounds with this. He tired a diet including Meridian and Xenical lost about 70 pounds with that. Each time he has gained his weight back.  Per the 1991 NIH Consensus Statement, the patient is a candidate for bariatric surgery. The patient attended an information session and reviewed the types of bariatric surgery.  The patient is interested in the  Roux en Y Gastric Bypass. I discussed with the patient the indications and risks of bariatric surgery. The potential risks of surgery include, but are not limited to, bleeding, infection, leak from the bowel, DVT and PE, open surgery, long term nutrition consequences, and death. The patient understands the importance of compliance and long term follow-up with our group after surgery.  Past medical history: 1. 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 HTN x 10 years 3. Sleep apnea - since 1999 He wears CPAP at night and really misses it when he does not have it. 4. Back surgery in 2008 Still has some back spasms. He uses a mixture of meds - including Tramadol to control this. 5. Panic attacks This seems to happen every 2 to 4 weeks and has started occurring just recently - where he feels that he just cannot breath/catch his breath. He says that his throat is "constricting". 6. His last colonoscopy was 5 years ago. 7. He has had more allergies this year than normal. He does not have asthma. 8. 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   Problem List/Past Medical Kandis Cocking, MD; 04/10/2016 2:51 PM) OBESITY, MORBID, BMI 40.0-49.9 SLEEP APNEA IN ADULT (G47.30) ESSENTIAL HYPERTENSION (I10)  Other Problems Kandis Cocking, MD; 04/10/2016 2:51 PM) Arthritis Back Pain  Depression High blood pressure Hypercholesterolemia Sleep Apnea  Past Surgical History Kandis Cocking, MD; 04/10/2016 2:51 PM) Knee Surgery Bilateral. Oral Surgery Spinal Surgery - Lower Back Vasectomy  Allergies Fay Records, CMA; 04/10/2016 2:06 PM) No Known Drug Allergies06/23/2016  Medication History Fay Records, CMA; 04/10/2016 2:09 PM) Lisinopril (20MG  Tablet, Oral) Active. Multivitamins (Oral)  Active. Simvastatin (20MG  Tablet, Oral) Active. Aspirin EC (325MG  Tablet DR, Oral) Active. Flexeril (10MG  Tablet, Oral) Active. TraMADol HCl (50MG  Tablet, Oral) Active. Medications Reconciled Tylenol (325MG  Tablet, Oral) Active.    Review of Systems Kandis Cocking, MD; 04/11/2016 12:41 PM) General Present- Fatigue and Weight Gain. Not Present- Appetite Loss, Chills, Fever, Night Sweats and Weight Loss. Skin Not Present- Change in Wart/Mole, Dryness, Hives, Jaundice, New Lesions, Non-Healing Wounds, Rash and Ulcer. HEENT Present- Seasonal Allergies and Wears glasses/contact lenses. Not Present- Earache, Hearing Loss, Hoarseness, Nose Bleed, Oral Ulcers, Ringing in the Ears, Sinus Pain, Sore Throat, Visual Disturbances and Yellow Eyes. Respiratory Present- Snoring. Not Present- Bloody sputum, Chronic Cough, Difficulty Breathing and Wheezing. Cardiovascular Not Present- Chest Pain, Difficulty Breathing Lying Down, Leg Cramps, Palpitations, Rapid Heart Rate, Shortness of Breath and Swelling of Extremities. Gastrointestinal Not Present- Abdominal Pain, Bloating, Bloody Stool, Change in Bowel Habits, Chronic diarrhea, Constipation, Difficulty Swallowing, Excessive gas, Gets full quickly at meals, Hemorrhoids, Indigestion, Nausea, Rectal Pain and Vomiting. Male Genitourinary Not Present- Blood in Urine, Change in Urinary Stream, Frequency, Impotence, Nocturia, Painful Urination, Urgency and Urine Leakage. Musculoskeletal Present- Back Pain and Joint Pain. Not Present- Joint Stiffness, Muscle Pain, Muscle Weakness and Swelling of Extremities. Neurological Not Present- Decreased Memory, Fainting, Headaches, Numbness, Seizures, Tingling, Tremor, Trouble walking and Weakness. Psychiatric Present- Anxiety and Depression. Not Present- Bipolar, Change in Sleep Pattern, Fearful and Frequent crying.  Vitals Fay Records CMA; 04/10/2016 2:07 PM) 04/10/2016 2:06 PM Weight: 327.8 lb Height: 71in Body  Surface Area: 2.6 m Body Mass Index: 45.72 kg/m  Temp.: 45F  Pulse: 115 (Irregular)  BP: 136/80 (Sitting, Left Arm, Standard)  Physical Exam  General: Obese WM alert and generally healthy appearing. HEENT: Normal. Pupils equal.  Neck: Supple. No mass. No thyroid mass.  Lymph Nodes: No supraclavicular or cervical nodes.  Lungs: Clear to auscultation and symmetric breath sounds. Heart: RRR. No murmur or rub.  Abdomen: Soft. No mass. No tenderness. No hernia. Normal bowel sounds. No abdominal scars. He is about 1/2 apple and 1/2 pear. Rectal: Not done.  Extremities: Good strength and ROM in upper and lower extremities.  Neurologic: Grossly intact to motor and sensory function. Psychiatric: Has normal mood and affect. Behavior is normal.  Assessment & Plan  1.  OBESITY, MORBID, BMI 40.0-49.9  1) Has bowel prep   2) For gastric bypass on 04/20/2016  2.  SLEEP APNEA IN ADULT (G47.30) 3.  ESSENTIAL HYPERTENSION (I10) 4. Back surgery in 2008  Still has some back spasms. He uses a mixture of meds - including Tramadol to control this. 5. Panic attacks  This seems to happen every 2 to 4 weeks and has started occurring just recently.  Ovidio Kin, MD, [facility] Pager: [phone] Office phone:  [phone]

## 2016-04-20 ENCOUNTER — Inpatient Hospital Stay (HOSPITAL_COMMUNITY): Payer: BLUE CROSS/BLUE SHIELD | Admitting: Certified Registered Nurse Anesthetist

## 2016-04-20 ENCOUNTER — Encounter (HOSPITAL_COMMUNITY): Payer: Self-pay | Admitting: *Deleted

## 2016-04-20 ENCOUNTER — Encounter (HOSPITAL_COMMUNITY)
Admission: RE | Disposition: A | Discharge status: Home / Self Care | Payer: Self-pay | Source: Ambulatory Visit | Attending: Surgery

## 2016-04-20 ENCOUNTER — Inpatient Hospital Stay (HOSPITAL_COMMUNITY)
Admission: RE | Admit: 2016-04-20 | Discharge: 2016-04-24 | DRG: 620 | Disposition: A | Discharge status: Home / Self Care | Payer: BLUE CROSS/BLUE SHIELD | Source: Ambulatory Visit | Attending: Surgery | Admitting: Surgery

## 2016-04-20 DIAGNOSIS — I10 Essential (primary) hypertension: Secondary | ICD-10-CM | POA: Diagnosis present

## 2016-04-20 DIAGNOSIS — M1990 Unspecified osteoarthritis, unspecified site: Secondary | ICD-10-CM | POA: Diagnosis present

## 2016-04-20 DIAGNOSIS — K420 Umbilical hernia with obstruction, without gangrene: Secondary | ICD-10-CM | POA: Diagnosis present

## 2016-04-20 DIAGNOSIS — Z79899 Other long term (current) drug therapy: Secondary | ICD-10-CM

## 2016-04-20 DIAGNOSIS — K429 Umbilical hernia without obstruction or gangrene: Secondary | ICD-10-CM

## 2016-04-20 DIAGNOSIS — G4730 Sleep apnea, unspecified: Secondary | ICD-10-CM | POA: Diagnosis present

## 2016-04-20 DIAGNOSIS — F329 Major depressive disorder, single episode, unspecified: Secondary | ICD-10-CM | POA: Diagnosis present

## 2016-04-20 DIAGNOSIS — M62830 Muscle spasm of back: Secondary | ICD-10-CM | POA: Diagnosis present

## 2016-04-20 DIAGNOSIS — E7800 Pure hypercholesterolemia, unspecified: Secondary | ICD-10-CM | POA: Diagnosis present

## 2016-04-20 DIAGNOSIS — Z6841 Body Mass Index (BMI) 40.0 and over, adult: Secondary | ICD-10-CM

## 2016-04-20 DIAGNOSIS — Z9884 Bariatric surgery status: Secondary | ICD-10-CM

## 2016-04-20 HISTORY — PX: GASTRIC ROUX-EN-Y: SHX5262

## 2016-04-20 LAB — HEMOGLOBIN AND HEMATOCRIT, BLOOD
HEMATOCRIT: 41.2 % (ref 39.0–52.0)
Hemoglobin: 14.4 g/dL (ref 13.0–17.0)

## 2016-04-20 SURGERY — LAPAROSCOPIC ROUX-EN-Y GASTRIC BYPASS WITH UPPER ENDOSCOPY
Anesthesia: General

## 2016-04-20 MED ORDER — DEXAMETHASONE SODIUM PHOSPHATE 10 MG/ML IJ SOLN
INTRAMUSCULAR | Status: DC | PRN
  Administered 2016-04-20: 10 mg via INTRAVENOUS

## 2016-04-20 MED ORDER — BUPIVACAINE HCL (PF) 0.25 % IJ SOLN
INTRAMUSCULAR | Status: AC
  Filled 2016-04-20: qty 30

## 2016-04-20 MED ORDER — PHENYLEPHRINE HCL 10 MG/ML IJ SOLN
INTRAMUSCULAR | Status: DC | PRN
  Administered 2016-04-20 (×5): 40 ug via INTRAVENOUS

## 2016-04-20 MED ORDER — ROCURONIUM BROMIDE 100 MG/10ML IV SOLN
INTRAVENOUS | Status: AC
  Filled 2016-04-20: qty 1

## 2016-04-20 MED ORDER — 0.9 % SODIUM CHLORIDE (POUR BTL) OPTIME
TOPICAL | Status: DC | PRN
  Administered 2016-04-20: 1000 mL

## 2016-04-20 MED ORDER — BUPIVACAINE HCL (PF) 0.25 % IJ SOLN
INTRAMUSCULAR | Status: DC | PRN
  Administered 2016-04-20: 30 mL

## 2016-04-20 MED ORDER — MIDAZOLAM HCL 2 MG/2ML IJ SOLN: 0.5000 mg | Freq: Once | INTRAMUSCULAR | Status: DC | PRN

## 2016-04-20 MED ORDER — ROCURONIUM BROMIDE 100 MG/10ML IV SOLN
INTRAVENOUS | Status: DC | PRN
  Administered 2016-04-20: 20 mg via INTRAVENOUS
  Administered 2016-04-20: 10 mg via INTRAVENOUS
  Administered 2016-04-20: 5 mg via INTRAVENOUS
  Administered 2016-04-20: 50 mg via INTRAVENOUS

## 2016-04-20 MED ORDER — SUGAMMADEX SODIUM 500 MG/5ML IV SOLN
INTRAVENOUS | Status: AC
  Filled 2016-04-20: qty 5

## 2016-04-20 MED ORDER — ONDANSETRON HCL 4 MG/2ML IJ SOLN
INTRAMUSCULAR | Status: AC
  Filled 2016-04-20: qty 2

## 2016-04-20 MED ORDER — TISSEEL VH 10 ML EX KIT
PACK | CUTANEOUS | Status: AC
  Filled 2016-04-20: qty 1

## 2016-04-20 MED ORDER — MEPERIDINE HCL 50 MG/ML IJ SOLN: 6.2500 mg | INTRAMUSCULAR | Status: DC | PRN

## 2016-04-20 MED ORDER — PREMIER PROTEIN SHAKE
2.0000 [oz_av] | Freq: Four times a day (QID) | ORAL | Status: DC
  Administered 2016-04-22 – 2016-04-24 (×5): 2 [oz_av] via ORAL

## 2016-04-20 MED ORDER — LIDOCAINE HCL (CARDIAC) 20 MG/ML IV SOLN
INTRAVENOUS | Status: DC | PRN
  Administered 2016-04-20: 75 mg via INTRAVENOUS

## 2016-04-20 MED ORDER — EVICEL 5 ML EX KIT
PACK | CUTANEOUS | Status: DC | PRN
  Administered 2016-04-20: 2

## 2016-04-20 MED ORDER — LACTATED RINGERS IV SOLN
INTRAVENOUS | Status: DC | PRN
  Administered 2016-04-20 (×3): via INTRAVENOUS

## 2016-04-20 MED ORDER — ONDANSETRON HCL 4 MG/2ML IJ SOLN
INTRAMUSCULAR | Status: DC | PRN
  Administered 2016-04-20: 4 mg via INTRAVENOUS

## 2016-04-20 MED ORDER — MIDAZOLAM HCL 2 MG/2ML IJ SOLN
INTRAMUSCULAR | Status: AC
  Filled 2016-04-20: qty 2

## 2016-04-20 MED ORDER — SODIUM CHLORIDE 0.9 % IJ SOLN
INTRAMUSCULAR | Status: AC
  Filled 2016-04-20: qty 10

## 2016-04-20 MED ORDER — FENTANYL CITRATE (PF) 100 MCG/2ML IJ SOLN
INTRAMUSCULAR | Status: DC | PRN
  Administered 2016-04-20 (×5): 50 ug via INTRAVENOUS

## 2016-04-20 MED ORDER — CEFOTETAN DISODIUM-DEXTROSE 2-2.08 GM-% IV SOLR
2.0000 g | INTRAVENOUS | Status: AC
  Administered 2016-04-20: 2 g via INTRAVENOUS

## 2016-04-20 MED ORDER — OXYCODONE HCL 5 MG/5ML PO SOLN
5.0000 mg | ORAL | Status: DC | PRN
  Administered 2016-04-21 – 2016-04-22 (×2): 5 mg via ORAL
  Administered 2016-04-23 – 2016-04-24 (×5): 10 mg via ORAL
  Filled 2016-04-20: qty 50
  Filled 2016-04-20: qty 5
  Filled 2016-04-20 (×4): qty 10
  Filled 2016-04-20: qty 5

## 2016-04-20 MED ORDER — LIDOCAINE HCL (CARDIAC) 20 MG/ML IV SOLN
INTRAVENOUS | Status: AC
  Filled 2016-04-20: qty 5

## 2016-04-20 MED ORDER — FENTANYL CITRATE (PF) 250 MCG/5ML IJ SOLN
INTRAMUSCULAR | Status: AC
  Filled 2016-04-20: qty 5

## 2016-04-20 MED ORDER — CHLORHEXIDINE GLUCONATE 4 % EX LIQD
60.0000 mL | Freq: Once | CUTANEOUS | Status: DC
Start: 2016-04-20 — End: 2016-04-20

## 2016-04-20 MED ORDER — ONDANSETRON HCL 4 MG/2ML IJ SOLN
4.0000 mg | INTRAMUSCULAR | Status: DC | PRN
  Administered 2016-04-21 – 2016-04-22 (×6): 4 mg via INTRAVENOUS
  Filled 2016-04-20 (×6): qty 2

## 2016-04-20 MED ORDER — MIDAZOLAM HCL 5 MG/5ML IJ SOLN
INTRAMUSCULAR | Status: DC | PRN
  Administered 2016-04-20: 0.5 mg via INTRAVENOUS
  Administered 2016-04-20: 1 mg via INTRAVENOUS
  Administered 2016-04-20: 0.5 mg via INTRAVENOUS

## 2016-04-20 MED ORDER — MORPHINE SULFATE (PF) 2 MG/ML IV SOLN
2.0000 mg | INTRAVENOUS | Status: DC | PRN
  Administered 2016-04-20: 2 mg via INTRAVENOUS
  Administered 2016-04-20 – 2016-04-21 (×12): 4 mg via INTRAVENOUS
  Administered 2016-04-22 – 2016-04-23 (×7): 2 mg via INTRAVENOUS
  Filled 2016-04-20 (×3): qty 2
  Filled 2016-04-20 (×2): qty 1
  Filled 2016-04-20 (×3): qty 2
  Filled 2016-04-20 (×2): qty 1
  Filled 2016-04-20: qty 2
  Filled 2016-04-20 (×3): qty 1
  Filled 2016-04-20 (×2): qty 2
  Filled 2016-04-20: qty 1
  Filled 2016-04-20 (×3): qty 2

## 2016-04-20 MED ORDER — SUGAMMADEX SODIUM 200 MG/2ML IV SOLN
INTRAVENOUS | Status: AC
  Filled 2016-04-20: qty 2

## 2016-04-20 MED ORDER — POTASSIUM CHLORIDE IN NACL 20-0.45 MEQ/L-% IV SOLN
INTRAVENOUS | Status: DC
  Administered 2016-04-20: 150 mL/h via INTRAVENOUS
  Administered 2016-04-20 – 2016-04-21 (×3): via INTRAVENOUS
  Administered 2016-04-21: 150 mL/h via INTRAVENOUS
  Administered 2016-04-21 – 2016-04-23 (×5): via INTRAVENOUS
  Filled 2016-04-20 (×15): qty 1000

## 2016-04-20 MED ORDER — CHLORHEXIDINE GLUCONATE 4 % EX LIQD: 60.0000 mL | Freq: Once | CUTANEOUS | Status: DC

## 2016-04-20 MED ORDER — ATROPINE SULFATE 0.4 MG/ML IJ SOLN
INTRAMUSCULAR | Status: AC
  Filled 2016-04-20: qty 1

## 2016-04-20 MED ORDER — HYDROMORPHONE HCL 1 MG/ML IJ SOLN
0.2500 mg | INTRAMUSCULAR | Status: DC | PRN
Start: 2016-04-20 — End: 2016-04-20

## 2016-04-20 MED ORDER — SUGAMMADEX SODIUM 500 MG/5ML IV SOLN
INTRAVENOUS | Status: DC | PRN
  Administered 2016-04-20: 400 mg via INTRAVENOUS

## 2016-04-20 MED ORDER — ACETAMINOPHEN 160 MG/5ML PO SOLN
650.0000 mg | ORAL | Status: DC | PRN
  Administered 2016-04-23: 650 mg via ORAL
  Filled 2016-04-20: qty 20.3

## 2016-04-20 MED ORDER — PROPOFOL 10 MG/ML IV BOLUS
INTRAVENOUS | Status: AC
  Filled 2016-04-20: qty 20

## 2016-04-20 MED ORDER — EPHEDRINE SULFATE 50 MG/ML IJ SOLN
INTRAMUSCULAR | Status: AC
  Filled 2016-04-20: qty 1

## 2016-04-20 MED ORDER — LACTATED RINGERS IR SOLN
Status: DC | PRN
  Administered 2016-04-20: 1

## 2016-04-20 MED ORDER — HEPARIN SODIUM (PORCINE) 5000 UNIT/ML IJ SOLN
5000.0000 [IU] | INTRAMUSCULAR | Status: AC
  Administered 2016-04-20: 5000 [IU] via SUBCUTANEOUS
  Filled 2016-04-20: qty 1

## 2016-04-20 MED ORDER — DEXAMETHASONE SODIUM PHOSPHATE 10 MG/ML IJ SOLN
INTRAMUSCULAR | Status: AC
  Filled 2016-04-20: qty 1

## 2016-04-20 MED ORDER — HEPARIN SODIUM (PORCINE) 5000 UNIT/ML IJ SOLN
5000.0000 [IU] | Freq: Three times a day (TID) | INTRAMUSCULAR | Status: DC
  Administered 2016-04-20 – 2016-04-24 (×11): 5000 [IU] via SUBCUTANEOUS
  Filled 2016-04-20 (×15): qty 1

## 2016-04-20 MED ORDER — CEFOTETAN DISODIUM-DEXTROSE 2-2.08 GM-% IV SOLR
INTRAVENOUS | Status: AC
  Filled 2016-04-20: qty 50

## 2016-04-20 MED ORDER — PROPOFOL 10 MG/ML IV BOLUS
INTRAVENOUS | Status: DC | PRN
  Administered 2016-04-20: 200 mg via INTRAVENOUS

## 2016-04-20 MED ORDER — SUCCINYLCHOLINE CHLORIDE 20 MG/ML IJ SOLN
INTRAMUSCULAR | Status: DC | PRN
  Administered 2016-04-20: 140 mg via INTRAVENOUS

## 2016-04-20 MED ORDER — ACETAMINOPHEN 160 MG/5ML PO SOLN: 325.0000 mg | ORAL | Status: DC | PRN

## 2016-04-20 SURGICAL SUPPLY — 83 items
APL SRG 32X5 SNPLK LF DISP (MISCELLANEOUS) ×1
APPLIER CLIP ROT 10 11.4 M/L (STAPLE)
APPLIER CLIP ROT 13.4 12 LRG (CLIP)
APR CLP LRG 13.4X12 ROT 20 MLT (CLIP)
APR CLP MED LRG 11.4X10 (STAPLE)
BLADE SURG 15 STRL LF DISP TIS (BLADE) ×1 IMPLANT
BLADE SURG 15 STRL SS (BLADE) ×3
CABLE HIGH FREQUENCY MONO STRZ (ELECTRODE) IMPLANT
CHLORAPREP W/TINT 26ML (MISCELLANEOUS) ×6 IMPLANT
CLIP APPLIE ROT 10 11.4 M/L (STAPLE) IMPLANT
CLIP APPLIE ROT 13.4 12 LRG (CLIP) IMPLANT
CLIP SUT LAPRA TY ABSORB (SUTURE) ×8 IMPLANT
COVER SURGICAL LIGHT HANDLE (MISCELLANEOUS) IMPLANT
CUTTER FLEX LINEAR 45M (STAPLE) ×2 IMPLANT
DECANTER SPIKE VIAL GLASS SM (MISCELLANEOUS) ×3 IMPLANT
DEVICE SUT QUICK LOAD TK 5 (STAPLE) IMPLANT
DEVICE SUT TI-KNOT TK 5X26 (MISCELLANEOUS) IMPLANT
DEVICE SUTURE ENDOST 10MM (ENDOMECHANICALS) ×5 IMPLANT
DEVICE TI KNOT TK5 (MISCELLANEOUS)
DISSECTOR BLUNT TIP ENDO 5MM (MISCELLANEOUS) IMPLANT
DRAIN PENROSE 18X1/4 LTX STRL (WOUND CARE) ×3 IMPLANT
GAUZE SPONGE 4X4 16PLY XRAY LF (GAUZE/BANDAGES/DRESSINGS) ×3 IMPLANT
GLOVE SURG SIGNA 7.5 PF LTX (GLOVE) ×3 IMPLANT
GOWN STRL REUS W/TWL XL LVL3 (GOWN DISPOSABLE) ×12 IMPLANT
HOVERMATT SINGLE USE (MISCELLANEOUS) ×3 IMPLANT
KIT BASIN OR (CUSTOM PROCEDURE TRAY) ×3 IMPLANT
KIT GASTRIC LAVAGE 34FR ADT (SET/KITS/TRAYS/PACK) ×3 IMPLANT
LIQUID BAND (GAUZE/BANDAGES/DRESSINGS) ×3 IMPLANT
LUBRICANT JELLY K Y 4OZ (MISCELLANEOUS) ×3 IMPLANT
MARKER SKIN DUAL TIP RULER LAB (MISCELLANEOUS) ×3 IMPLANT
NDL SPNL 22GX3.5 QUINCKE BK (NEEDLE) ×1 IMPLANT
NEEDLE SPNL 22GX3.5 QUINCKE BK (NEEDLE) ×3 IMPLANT
PACK CARDIOVASCULAR III (CUSTOM PROCEDURE TRAY) ×3 IMPLANT
QUICK LOAD TK 5 (STAPLE)
RELOAD 45 VASCULAR/THIN (ENDOMECHANICALS) ×6 IMPLANT
RELOAD ENDO STITCH 2.0 (ENDOMECHANICALS) ×54
RELOAD STAPLE 45 2.5 WHT GRN (ENDOMECHANICALS) ×2 IMPLANT
RELOAD STAPLE 45 3.5 BLU ETS (ENDOMECHANICALS) ×2 IMPLANT
RELOAD STAPLE 60 2.6 WHT THN (STAPLE) IMPLANT
RELOAD STAPLE 60 3.6 BLU REG (STAPLE) ×1 IMPLANT
RELOAD STAPLE 60 3.8 GOLD REG (STAPLE) IMPLANT
RELOAD STAPLE TA45 3.5 REG BLU (ENDOMECHANICALS) ×6 IMPLANT
RELOAD STAPLER BLUE 60MM (STAPLE) ×1 IMPLANT
RELOAD STAPLER GOLD 60MM (STAPLE) ×1 IMPLANT
RELOAD STAPLER WHITE 60MM (STAPLE) IMPLANT
RELOAD SUT SNGL STCH ABSRB 2-0 (ENDOMECHANICALS) ×6 IMPLANT
RELOAD SUT SNGL STCH BLK 2-0 (ENDOMECHANICALS) ×4 IMPLANT
SCISSORS LAP 5X35 DISP (ENDOMECHANICALS) ×3 IMPLANT
SEALANT SURGICAL APPL DUAL CAN (MISCELLANEOUS) ×3 IMPLANT
SET IRRIG TUBING LAPAROSCOPIC (IRRIGATION / IRRIGATOR) ×3 IMPLANT
SHEARS HARMONIC ACE PLUS 36CM (ENDOMECHANICALS) ×3 IMPLANT
SLEEVE ADV FIXATION 12X100MM (TROCAR) IMPLANT
SLEEVE XCEL OPT CAN 5 100 (ENDOMECHANICALS) IMPLANT
SOLUTION ANTI FOG 6CC (MISCELLANEOUS) ×3 IMPLANT
STAPLE ECHEON FLEX 60 POW ENDO (STAPLE) ×3 IMPLANT
STAPLER RELOAD BLUE 60MM (STAPLE) ×3
STAPLER RELOAD GOLD 60MM (STAPLE) ×3
STAPLER RELOAD WHITE 60MM (STAPLE)
SUT MNCRL AB 4-0 PS2 18 (SUTURE) ×5 IMPLANT
SUT RELOAD ENDO STITCH 2 48X1 (ENDOMECHANICALS) ×9
SUT RELOAD ENDO STITCH 2.0 (ENDOMECHANICALS) ×9
SUT SURGIDAC NAB ES-9 0 48 120 (SUTURE) IMPLANT
SUT VIC AB 2-0 SH 27 (SUTURE) ×3
SUT VIC AB 2-0 SH 27X BRD (SUTURE) ×1 IMPLANT
SUTURE RELOAD END STTCH 2 48X1 (ENDOMECHANICALS) ×9 IMPLANT
SUTURE RELOAD ENDO STITCH 2.0 (ENDOMECHANICALS) ×9 IMPLANT
SYR 10ML ECCENTRIC (SYRINGE) ×3 IMPLANT
SYR 20CC LL (SYRINGE) ×6 IMPLANT
SYR 50ML LL SCALE MARK (SYRINGE) ×3 IMPLANT
TOWEL OR 17X26 10 PK STRL BLUE (TOWEL DISPOSABLE) ×3 IMPLANT
TRAY FOLEY W/METER SILVER 14FR (SET/KITS/TRAYS/PACK) ×3 IMPLANT
TRAY FOLEY W/METER SILVER 16FR (SET/KITS/TRAYS/PACK) ×3 IMPLANT
TROCAR ADV FIXATION 11X100MM (TROCAR) IMPLANT
TROCAR ADV FIXATION 12X100MM (TROCAR) IMPLANT
TROCAR ADV FIXATION 5X100MM (TROCAR) IMPLANT
TROCAR BLADELESS OPT 5 100 (ENDOMECHANICALS) IMPLANT
TROCAR UNIVERSAL OPT 12M 100M (ENDOMECHANICALS) IMPLANT
TROCAR XCEL 12X100 BLDLESS (ENDOMECHANICALS) IMPLANT
TROCAR XCEL NON-BLD 11X100MML (ENDOMECHANICALS) ×3 IMPLANT
TUBING CONNECTING 10 (TUBING) IMPLANT
TUBING CONNECTING 10' (TUBING)
TUBING ENDO SMARTCAP (MISCELLANEOUS) ×3 IMPLANT
TUBING INSUF HEATED (TUBING) ×3 IMPLANT

## 2016-04-20 NOTE — Interval H&P Note (Signed)
History and Physical Interval Note:  04/20/2016 7:02 AM  Derrick [name]  has presented today for surgery, with the diagnosis of MORBID OBESITY  The various methods of treatment have been discussed with the patient and family.  His wife is here with him today.  After consideration of risks, benefits and other options for treatment, the patient has consented to  Procedure(s): LAPAROSCOPIC ROUX-EN-Y GASTRIC BYPASS WITH UPPER ENDOSCOPY (N/A) as a surgical intervention .  The patient's history has been reviewed, patient examined, no change in status, stable for surgery.  I have reviewed the patient's chart and labs.  Questions were answered to the patient's satisfaction.     Naila Elizondo,Horacio H

## 2016-04-20 NOTE — Anesthesia Postprocedure Evaluation (Signed)
Anesthesia Post Note  Patient: Derrick [name]  Procedure(s) Performed: Procedure(s) (LRB): LAPAROSCOPIC ROUX-EN-Y GASTRIC BYPASS WITH UPPER ENDOSCOPY (N/A)  Patient location during evaluation: PACU Anesthesia Type: General Level of consciousness: awake and alert, oriented and patient cooperative Pain management: pain level controlled Vital Signs Assessment: post-procedure vital signs reviewed and stable Respiratory status: spontaneous breathing, nonlabored ventilation and respiratory function stable Cardiovascular status: blood pressure returned to baseline and stable Postop Assessment: no signs of nausea or vomiting Anesthetic complications: no    Last Vitals:  Filed Vitals:   04/20/16 1200 04/20/16 1213  BP: 138/72 150/72  Pulse: 94   Temp:  36.8 C  Resp:      Last Pain:  Filed Vitals:   04/20/16 1216  PainSc: 0-No pain                 Vadie Principato,E. Whitten Andreoni

## 2016-04-20 NOTE — Anesthesia Preprocedure Evaluation (Addendum)
Anesthesia Evaluation  Patient identified by MRN, date of birth, ID band Patient awake    Reviewed: Allergy & Precautions, NPO status , Patient's Chart, lab work & pertinent test results  Airway Mallampati: II  TM Distance: >3 FB Neck ROM: Full    Dental  (+) Dental Advisory Given, Teeth Intact   Pulmonary sleep apnea and Continuous Positive Airway Pressure Ventilation ,    breath sounds clear to auscultation       Cardiovascular hypertension, Pt. on medications (-) angina Rhythm:Regular Rate:Normal     Neuro/Psych negative neurological ROS     GI/Hepatic negative GI ROS, Neg liver ROS,   Endo/Other  Morbid obesity  Renal/GU negative Renal ROS     Musculoskeletal  (+) Arthritis , Osteoarthritis,    Abdominal (+) + obese,   Peds  Hematology negative hematology ROS (+)   Anesthesia Other Findings   Reproductive/Obstetrics                           Anesthesia Physical Anesthesia Plan  ASA: III  Anesthesia Plan: General   Post-op Pain Management:    Induction: Intravenous  Airway Management Planned: Oral ETT  Additional Equipment:   Intra-op Plan:   Post-operative Plan: Extubation in OR  Informed Consent: I have reviewed the patients History and Physical, chart, labs and discussed the procedure including the risks, benefits and alternatives for the proposed anesthesia with the patient or authorized representative who has indicated his/her understanding and acceptance.   Dental advisory given  Plan Discussed with: Surgeon and CRNA  Anesthesia Plan Comments: (Plan routine monitors, GETA)        Anesthesia Quick Evaluation

## 2016-04-20 NOTE — Anesthesia Procedure Notes (Signed)
Procedure Name: Intubation Date/Time: 04/20/2016 7:33 AM Performed by: Edison PaceGRAY, Caryssa Elzey E Pre-anesthesia Checklist: Patient identified, Emergency Drugs available, Suction available, Patient being monitored and Timeout performed Patient Re-evaluated:Patient Re-evaluated prior to inductionOxygen Delivery Method: Circle system utilized Preoxygenation: Pre-oxygenation with 100% oxygen Intubation Type: IV induction Ventilation: Mask ventilation without difficulty Laryngoscope Size: Mac and 4 Grade View: Grade II Tube type: Oral Tube size: 7.5 mm Number of attempts: 1 Airway Equipment and Method: Stylet Placement Confirmation: ETT inserted through vocal cords under direct vision,  positive ETCO2 and breath sounds checked- equal and bilateral Secured at: 20 cm Tube secured with: Tape Dental Injury: Teeth and Oropharynx as per pre-operative assessment  Comments: Cords visualized with head lift and mac 4

## 2016-04-20 NOTE — Op Note (Signed)
PATIENT:   Derrick [name] DOB:   06-Jun-1956 MRN:   [id number]  DATE OF PROCEDURE: 04/20/2016                   FACILITY:  [facility]  OPERATIVE REPORT  PREOPERATIVE DIAGNOSIS:  Morbid obesity.  POSTOPERATIVE DIAGNOSIS:  Morbid obesity (weight 327, BMI of 45.7).  PROCEDURE:  Laparoscopic Roux-en-Y gastric bypass, antecolic, antegastric (intraoperative upper endoscopy by Dr. Johna Sheriff)  SURGEON:  Sandria Bales. Ezzard Standing, MD  FIRST ASSISTANT:  B. Hoxworth, MD  ANESTHESIA:  General endotracheal.  Anesthesiologist: Jairo Ben, MD CRNA: Edison Pace, CRNA; Elyn Peers, CRNA  General  ESTIMATED BLOOD LOSS:  Minimal.  LOCAL ANESTHESIA:  30 cc of 1/4% Marcaine  COMPLICATIONS:  None.  INDICATION FOR SURGERY:  Derrick [name] is a 60 y.o. white  male who sees Lolita Patella, MD as his primary care doctor.  She has completed our preoperative bariatric program and now comes for a laparoscopic Roux-en-Y gastric bypass.  The indications, potential complications of surgery were explained to the patient.  Potential complications of the surgery include, but are not limited to, bleeding, infection, DVT, open surgery, and long-term nutritional consequences.  OPERATIVE NOTE:  The patient taken to room #4 at [facility] where he underwent a general endotracheal anesthetic, supervised by Anesthesiologist: Jairo Ben, MD CRNA: Edison Pace, CRNA; Elyn Peers, CRNA.  The patient was given 2 g of cefotetan at the beginning of the procedure.  A time-out was held and surgical checklist run.  The abdomen was prepped with ChloraPrep and sterilely draped.  I accessed the abdominal cavity through the left upper quadrant using a 12 mm Optiview trocar.  I placed 6 additional trocars: 5 mm subxiphoid, 12 mm right subcostal, 12 mm right paramedian, 12 mm left paramedian, 5 mm lateral subcostal, and a 11 mm below to the right of the umbilicus.  The abdomen was insufflated and abdominal exploration carried  out.  Right and left lobes of liver unremarkable.  The stomach that I could see was unremarkable.  The gall bladder was unremarkable. The patient had a moderate amount of greater omentum which draped over the bowel.  He did have a 2 cm umbilical hernia with omentum stuck in the hernia.  I pulled the omentum out of the hernia because I had to mobilize the omentum.  I thought the hernia was small enough to leave alone at this time.  I was able to push the omentum and transverse colon up and identified the ligament of Treitz to start the operation.  I measured 40 cm of the jejunum, starting at the ligament of Tritz, and divided the jejunum with a white load of 45 mm Ethicon Endo-GIA stapler.  I divided a short length into the mesentery.  I measured 100 cm of jejunum for the future gastric limb.  I put a Penrose drain on the future gastric limb of the jejunum.  I then did a side-to-side jejunojejunostomy.  I used a 45 mm white load of the Ethicon Endo-GIA stapler.  I closed the enterotomy with 2 running 2-0 Vicryl sutures.  I tested the JJ anastomosis with an alligator forceps and then covered this with Tisseel.  I closed the mesenteric defect with a running 2-0 silk suture with a Laparo-tye on each end.  I then divided the omentum with a Harmonic Scalpel.  I positioned the patient in reverse Trendelenburg and placed the liver retractor, which was introduced into the peritoneal cavity through a  subxiphoid 5 mm trocar puncture, under the left lobe of the liver.  I then identified the gastroesophageal junction.  I went to the left at the angle of His and made a window at the left side of the esophago-gastric junction for a target as my dissection.  I then went on the lesser curve of the stomach, measured 5 cm from the gastroesophageal junction down the lesser curve and dissected into the lesser sac from the lesser curvature side of the stomach.  I did the first firing of a 60 mm gold load Ethicon Exchelon  stapler and then did 3 firings of the 60 mm blue load Ethicon Eschelon stapler.  This created a gastric pouch approximately 5 cm in length and 3 cm in width.  There was no bleeding from either the pouch or the stomach remnant site.  I placed Tisseel on the pouch side along the new greater curvature.  I over sewed the gastric remnant with a locking 2-0 Vicryl suture with a Laparo-tye on each end..  I then brought the jejunum ante-colic, ante-gastric up to the new stomach pouch and placed a posterior running 2-0 Vicryl suture.  I then made an enterotomy into the stomach using the Ewald as a back stop and an enterotomy into the jejunum.  I did a stapled side-to-side gastrojejunal anastomosis using these two enterotomies with a 45 mm blue load of the Ethicon Endo GIA stapler.  I tried to create a 2.5 cm gastrojejunal anastomosis.  I closed the enterotomy with a 2 running 2-0 Vicryl sutures.  I passed the Ewald tube through the gastrojejunal anastomosis and then did an anterior Connell suture running of 2-0 Vicryl suture for the anterior layer of the gastrojejunostomy.  The Ewald tube was then removed without difficulty.  I then closed the Dupo defect with a figure-of-eight 2-0 silk suture between the mesentery of the transverse colon and the mesentery of the distal jejunum.  Dr. Johna Sheriff then scrubbed out and did an intraoperative upper endoscopy.  He identified the esophagogastric junction about 40 cm, the gastrojejunal anastomosis about 47 cm.  I clamped off the small bowel.  He insufflated air and I flooded the abdomen with saline. There was no bubbling or evidence of air leak.  He then withdrew the scope and he will dictate that portion of the operation.    Also preoperatively, there was a question about a distal esophageal stricture, but none was found during the endoscopy.  I then re-inspected the anastomoses, sucked out the saline, placed Tisseel over the stomach pouch and gastrojejunal  anastomosis.   The liver retractor was removed.  The trocars were removed.  There was no bleeding at any trocar site.  The skin at each trocar site was closed with a 4-0 Monocryl suture.  I infiltrated a total about 30 cc of 0.25% Marcaine at the trocar sites.    After the skin incisions were closed with sutures they were painted with LiquidBand.  The sponge and needle count were correct at the end of the case.  The patient tolerated the procedure well, was transported to the recovery room in good condition.   Ovidio Kin, MD, [facility] Surgery Pager: [phone] Office phone:  [phone]

## 2016-04-20 NOTE — Transfer of Care (Signed)
Immediate Anesthesia Transfer of Care Note  Patient: Derrick [name]  Procedure(s) Performed: Procedure(s): LAPAROSCOPIC ROUX-EN-Y GASTRIC BYPASS WITH UPPER ENDOSCOPY (N/A)  Patient Location: PACU  Anesthesia Type:General  Level of Consciousness: awake, alert , oriented and patient cooperative  Airway & Oxygen Therapy: Patient Spontanous Breathing and Patient connected to face mask oxygen  Post-op Assessment: Report given to RN, Post -op Vital signs reviewed and stable and Patient moving all extremities  Post vital signs: Reviewed and stable  Last Vitals:  Filed Vitals:   04/20/16 0520  BP: 109/71  Pulse: 112  Temp: 36.7 C  Resp: 18    Last Pain:  Filed Vitals:   04/20/16 0558  PainSc: 0-No pain      Patients Stated Pain Goal: 4 (04/20/16 0557)  Complications: No apparent anesthesia complications

## 2016-04-20 NOTE — Op Note (Signed)
Upper GI endoscopy is performed at the completion of laparoscopic Roux-en-Y gastric bypass by Dr. Ezzard StandingNewman. The Olympus video endoscope was inserted into the upper esophagus and then passed under direct vision to the EG junction. The small gastric pouch was insufflated with air while the gastric outlet was clamped under irrigation by the operating surgeon. There was no evidence of leak. The anastomosis was visualized and was patent. Suture and staple lines were intact and without bleeding. The pouch was tubular and measured 6-7 cm in length. At the completion of the procedure the pouch was desufflated and the scope withdrawn.

## 2016-04-21 ENCOUNTER — Inpatient Hospital Stay (HOSPITAL_COMMUNITY): Payer: BLUE CROSS/BLUE SHIELD

## 2016-04-21 LAB — CBC WITH DIFFERENTIAL/PLATELET
BASOS ABS: 0 10*3/uL (ref 0.0–0.1)
BASOS PCT: 0 %
Eosinophils Absolute: 0 10*3/uL (ref 0.0–0.7)
Eosinophils Relative: 0 %
HEMATOCRIT: 40.7 % (ref 39.0–52.0)
HEMOGLOBIN: 13.8 g/dL (ref 13.0–17.0)
Lymphocytes Relative: 7 %
Lymphs Abs: 1 10*3/uL (ref 0.7–4.0)
MCH: 30 pg (ref 26.0–34.0)
MCHC: 33.9 g/dL (ref 30.0–36.0)
MCV: 88.5 fL (ref 78.0–100.0)
MONOS PCT: 9 %
Monocytes Absolute: 1.2 10*3/uL — ABNORMAL HIGH (ref 0.1–1.0)
NEUTROS ABS: 11.3 10*3/uL — AB (ref 1.7–7.7)
NEUTROS PCT: 84 %
Platelets: 261 10*3/uL (ref 150–400)
RBC: 4.6 MIL/uL (ref 4.22–5.81)
RDW: 14 % (ref 11.5–15.5)
WBC: 13.5 10*3/uL — AB (ref 4.0–10.5)

## 2016-04-21 LAB — HEMOGLOBIN AND HEMATOCRIT, BLOOD
HEMATOCRIT: 46.2 % (ref 39.0–52.0)
Hemoglobin: 15.7 g/dL (ref 13.0–17.0)

## 2016-04-21 MED ORDER — PROCHLORPERAZINE EDISYLATE 5 MG/ML IJ SOLN
10.0000 mg | Freq: Three times a day (TID) | INTRAMUSCULAR | Status: DC | PRN
  Administered 2016-04-21: 10 mg via INTRAVENOUS
  Filled 2016-04-21: qty 2

## 2016-04-21 MED ORDER — DIATRIZOATE MEGLUMINE & SODIUM 66-10 % PO SOLN
50.0000 mL | Freq: Once | ORAL | Status: AC
  Administered 2016-04-21: 50 mL via ORAL

## 2016-04-21 NOTE — Progress Notes (Signed)
Patient placed self on CPAP. Sterile water was added to water chamber for humidification. 2 liters oxygen was bleed into tubing. RT will continue to monitor

## 2016-04-21 NOTE — Progress Notes (Signed)
Patient alert and oriented, pain is controlled. Patient is tolerating fluids, plan to advance to protein shake tomorrow.  Reviewed Gastric sleeve discharge instructions with patient and wife and patient is able to articulate understanding.  Provided information on BELT program, Support Group and [facility] outpatient pharmacy. All questions answered, will continue to monitor.

## 2016-04-21 NOTE — Discharge Instructions (Addendum)

## 2016-04-21 NOTE — Progress Notes (Signed)
Patient alert and oriented, Post op day 1.  Provided support and encouragement.  Encouraged pulmonary toilet, ambulation and small sips of liquids.  All questions answered.  Will continue to monitor. 

## 2016-04-21 NOTE — Progress Notes (Signed)
[facility]:  [phone] General Surgery Progress Note   LOS: 1 day  POD -  1 Day Post-Op  Assessment/Plan: 1. LAPAROSCOPIC ROUX-EN-Y GASTRIC BYPASS WITH UPPER ENDOSCOPY - 04/20/2016 - D. Tondalaya Perren  For morbid obesity - weight 327, BMI of 45.7  For swallow today.  2. SLEEP APNEA IN ADULT (G47.30) 3. ESSENTIAL HYPERTENSION (I10) 4. Back surgery in 2008 Still has some back spasms. He uses a mixture of meds - including Tramadol to control this. 5. Panic attacks This seems to happen every 2 to 4 weeks and has started occurring just recently.  6.   DVT prophylaxis - SQ Heparin  Active Problems:   Morbid obesity (HCC)   Subjective:  Doing okay.  Objective:   Filed Vitals:   04/21/16 0200 04/21/16 0645  BP: 141/88 144/74  Pulse: 104 96  Temp: 98.2 F (36.8 C) 98.9 F (37.2 C)  Resp:  18     Intake/Output from previous day:  05/01 0701 - 05/02 0700 In: 4527.5 [I.V.:4527.5] Out: 1698 [Urine:1673; Blood:25]  Intake/Output this shift:      Physical Exam:   General: Obese WM who is alert and oriented.    HEENT: Normal. Pupils equal. .   Lungs: Clear   Abdomen: Soft   Wound: Clean   Lab Results:    Recent Labs  04/20/16 1751 04/21/16 0424  WBC  --  13.5*  HGB 14.4 13.8  HCT 41.2 40.7  PLT  --  261    BMET  No results for input(s): NA, K, CL, CO2, GLUCOSE, BUN, CREATININE, CALCIUM in the last 72 hours.  PT/INR  No results for input(s): LABPROT, INR in the last 72 hours.  ABG  No results for input(s): PHART, HCO3 in the last 72 hours.  Invalid input(s): PCO2, PO2   Studies/Results:  No results found.   Anti-infectives:   Anti-infectives    Start     Dose/Rate Route Frequency Ordered Stop   04/20/16 0538  cefoTEtan in Dextrose 5% (CEFOTAN) IVPB 2 g     2 g Intravenous On call to O.R. 04/20/16 0538 04/20/16 0725      Ovidio Kinavid Janene Yousuf, MD, FACS Pager: [phone] [facility]:  [phone] 04/21/2016

## 2016-04-22 ENCOUNTER — Inpatient Hospital Stay (HOSPITAL_COMMUNITY): Payer: BLUE CROSS/BLUE SHIELD | Admitting: Anesthesiology

## 2016-04-22 ENCOUNTER — Encounter (HOSPITAL_COMMUNITY): Payer: Self-pay | Admitting: Certified Registered"

## 2016-04-22 ENCOUNTER — Encounter (HOSPITAL_COMMUNITY)
Admission: RE | Disposition: A | Discharge status: Home / Self Care | Payer: Self-pay | Source: Ambulatory Visit | Attending: Surgery

## 2016-04-22 ENCOUNTER — Inpatient Hospital Stay (HOSPITAL_COMMUNITY): Payer: BLUE CROSS/BLUE SHIELD

## 2016-04-22 HISTORY — PX: UMBILICAL HERNIA REPAIR: SHX196

## 2016-04-22 LAB — CBC WITH DIFFERENTIAL/PLATELET
Basophils Absolute: 0 10*3/uL (ref 0.0–0.1)
Basophils Relative: 0 %
EOS ABS: 0 10*3/uL (ref 0.0–0.7)
Eosinophils Relative: 0 %
HCT: 46.9 % (ref 39.0–52.0)
Hemoglobin: 16.3 g/dL (ref 13.0–17.0)
LYMPHS ABS: 1.6 10*3/uL (ref 0.7–4.0)
LYMPHS PCT: 7 %
MCH: 30.8 pg (ref 26.0–34.0)
MCHC: 34.8 g/dL (ref 30.0–36.0)
MCV: 88.7 fL (ref 78.0–100.0)
MONO ABS: 2 10*3/uL — AB (ref 0.1–1.0)
Monocytes Relative: 9 %
Neutro Abs: 19.6 10*3/uL — ABNORMAL HIGH (ref 1.7–7.7)
Neutrophils Relative %: 84 %
Platelets: 320 10*3/uL (ref 150–400)
RBC: 5.29 MIL/uL (ref 4.22–5.81)
RDW: 14.1 % (ref 11.5–15.5)
WBC: 23.2 10*3/uL — AB (ref 4.0–10.5)

## 2016-04-22 SURGERY — REPAIR, HERNIA, UMBILICAL, LAPAROSCOPIC
Anesthesia: General

## 2016-04-22 MED ORDER — SUGAMMADEX SODIUM 500 MG/5ML IV SOLN
INTRAVENOUS | Status: DC | PRN
  Administered 2016-04-22: 300 mg via INTRAVENOUS

## 2016-04-22 MED ORDER — IOPAMIDOL (ISOVUE-300) INJECTION 61%
100.0000 mL | Freq: Once | INTRAVENOUS | Status: AC | PRN
  Administered 2016-04-22: 100 mL via INTRAVENOUS

## 2016-04-22 MED ORDER — BUPIVACAINE HCL (PF) 0.25 % IJ SOLN
INTRAMUSCULAR | Status: AC
  Filled 2016-04-22: qty 30

## 2016-04-22 MED ORDER — MIDAZOLAM HCL 5 MG/5ML IJ SOLN
INTRAMUSCULAR | Status: DC | PRN
  Administered 2016-04-22: 2 mg via INTRAVENOUS

## 2016-04-22 MED ORDER — SUGAMMADEX SODIUM 500 MG/5ML IV SOLN
INTRAVENOUS | Status: AC
  Filled 2016-04-22: qty 5

## 2016-04-22 MED ORDER — ROCURONIUM BROMIDE 100 MG/10ML IV SOLN
INTRAVENOUS | Status: AC
  Filled 2016-04-22: qty 1

## 2016-04-22 MED ORDER — METOCLOPRAMIDE HCL 5 MG/ML IJ SOLN: 10.0000 mg | Freq: Once | INTRAMUSCULAR | Status: DC | PRN

## 2016-04-22 MED ORDER — SODIUM CHLORIDE 0.9 % IV BOLUS (SEPSIS)
500.0000 mL | Freq: Once | INTRAVENOUS | Status: AC
  Administered 2016-04-22: 500 mL via INTRAVENOUS

## 2016-04-22 MED ORDER — MEPERIDINE HCL 50 MG/ML IJ SOLN: 6.2500 mg | INTRAMUSCULAR | Status: DC | PRN

## 2016-04-22 MED ORDER — CEFOTETAN DISODIUM-DEXTROSE 2-2.08 GM-% IV SOLR
INTRAVENOUS | Status: AC
  Filled 2016-04-22: qty 50

## 2016-04-22 MED ORDER — FENTANYL CITRATE (PF) 100 MCG/2ML IJ SOLN
INTRAMUSCULAR | Status: DC | PRN
  Administered 2016-04-22 (×5): 50 ug via INTRAVENOUS

## 2016-04-22 MED ORDER — ROCURONIUM BROMIDE 100 MG/10ML IV SOLN
INTRAVENOUS | Status: DC | PRN
  Administered 2016-04-22 (×2): 5 mg via INTRAVENOUS
  Administered 2016-04-22: 25 mg via INTRAVENOUS

## 2016-04-22 MED ORDER — LACTATED RINGERS IV SOLN: INTRAVENOUS | Status: DC

## 2016-04-22 MED ORDER — DEXTROSE 5 % IV SOLN
2.0000 g | Freq: Two times a day (BID) | INTRAVENOUS | Status: DC
  Administered 2016-04-23: 2 g via INTRAVENOUS
  Filled 2016-04-22 (×3): qty 2

## 2016-04-22 MED ORDER — BUPIVACAINE-EPINEPHRINE 0.25% -1:200000 IJ SOLN
INTRAMUSCULAR | Status: AC
  Filled 2016-04-22: qty 1

## 2016-04-22 MED ORDER — PROPOFOL 10 MG/ML IV BOLUS
INTRAVENOUS | Status: AC
  Filled 2016-04-22: qty 20

## 2016-04-22 MED ORDER — LACTATED RINGERS IV SOLN
INTRAVENOUS | Status: DC | PRN
  Administered 2016-04-22: 15:00:00 via INTRAVENOUS

## 2016-04-22 MED ORDER — LIDOCAINE HCL (CARDIAC) 20 MG/ML IV SOLN
INTRAVENOUS | Status: DC | PRN
  Administered 2016-04-22: 100 mg via INTRAVENOUS

## 2016-04-22 MED ORDER — FENTANYL CITRATE (PF) 250 MCG/5ML IJ SOLN
INTRAMUSCULAR | Status: AC
  Filled 2016-04-22: qty 5

## 2016-04-22 MED ORDER — MIDAZOLAM HCL 2 MG/2ML IJ SOLN
INTRAMUSCULAR | Status: AC
  Filled 2016-04-22: qty 2

## 2016-04-22 MED ORDER — LORAZEPAM BOLUS VIA INFUSION
0.5000 mg | INTRAVENOUS | Status: DC | PRN
  Filled 2016-04-22: qty 2

## 2016-04-22 MED ORDER — LORAZEPAM 2 MG/ML IJ SOLN: 0.5000 mg | Freq: Once | INTRAMUSCULAR | Status: DC | PRN

## 2016-04-22 MED ORDER — FENTANYL CITRATE (PF) 100 MCG/2ML IJ SOLN: 25.0000 ug | INTRAMUSCULAR | Status: DC | PRN

## 2016-04-22 MED ORDER — LORAZEPAM 2 MG/ML IJ SOLN
INTRAMUSCULAR | Status: AC
  Administered 2016-04-22: 0.5 mg
  Filled 2016-04-22: qty 1

## 2016-04-22 MED ORDER — BUPIVACAINE-EPINEPHRINE 0.25% -1:200000 IJ SOLN
INTRAMUSCULAR | Status: DC | PRN
  Administered 2016-04-22: 25 mL

## 2016-04-22 MED ORDER — SUCCINYLCHOLINE CHLORIDE 20 MG/ML IJ SOLN
INTRAMUSCULAR | Status: DC | PRN
  Administered 2016-04-22: 140 mg via INTRAVENOUS

## 2016-04-22 MED ORDER — ONDANSETRON HCL 4 MG/2ML IJ SOLN
INTRAMUSCULAR | Status: DC | PRN
  Administered 2016-04-22: 4 mg via INTRAVENOUS

## 2016-04-22 MED ORDER — DEXTROSE 5 % IV SOLN
2.0000 g | Freq: Two times a day (BID) | INTRAVENOUS | Status: DC
  Administered 2016-04-22: 2 g via INTRAVENOUS
  Filled 2016-04-22 (×4): qty 2

## 2016-04-22 MED ORDER — PROPOFOL 10 MG/ML IV BOLUS
INTRAVENOUS | Status: DC | PRN
  Administered 2016-04-22: 200 mg via INTRAVENOUS

## 2016-04-22 SURGICAL SUPPLY — 45 items
APPLIER CLIP 5 13 M/L LIGAMAX5 (MISCELLANEOUS)
APPLIER CLIP ROT 10 11.4 M/L (STAPLE)
APR CLP MED LRG 11.4X10 (STAPLE)
APR CLP MED LRG 5 ANG JAW (MISCELLANEOUS)
BINDER ABDOMINAL 12 ML 46-62 (SOFTGOODS) IMPLANT
CABLE HIGH FREQUENCY MONO STRZ (ELECTRODE) ×3 IMPLANT
CHLORAPREP W/TINT 26ML (MISCELLANEOUS) ×3 IMPLANT
CLIP APPLIE 5 13 M/L LIGAMAX5 (MISCELLANEOUS) IMPLANT
CLIP APPLIE ROT 10 11.4 M/L (STAPLE) IMPLANT
CLOSURE WOUND 1/2 X4 (GAUZE/BANDAGES/DRESSINGS)
COVER SURGICAL LIGHT HANDLE (MISCELLANEOUS) ×3 IMPLANT
DECANTER SPIKE VIAL GLASS SM (MISCELLANEOUS) ×3 IMPLANT
DEVICE SECURE STRAP 25 ABSORB (INSTRUMENTS) IMPLANT
DEVICE TROCAR PUNCTURE CLOSURE (ENDOMECHANICALS) ×3 IMPLANT
DRAPE INCISE IOBAN 66X45 STRL (DRAPES) ×3 IMPLANT
DRAPE LAPAROSCOPIC ABDOMINAL (DRAPES) ×3 IMPLANT
ELECT REM PT RETURN 9FT ADLT (ELECTROSURGICAL) ×3
ELECTRODE REM PT RTRN 9FT ADLT (ELECTROSURGICAL) ×1 IMPLANT
GLOVE SURG SIGNA 7.5 PF LTX (GLOVE) ×3 IMPLANT
GOWN STRL REUS W/TWL XL LVL3 (GOWN DISPOSABLE) ×9 IMPLANT
KIT BASIN OR (CUSTOM PROCEDURE TRAY) ×3 IMPLANT
LIQUID BAND (GAUZE/BANDAGES/DRESSINGS) ×2 IMPLANT
NDL SPNL 22GX3.5 QUINCKE BK (NEEDLE) ×1 IMPLANT
NEEDLE SPNL 22GX3.5 QUINCKE BK (NEEDLE) ×3 IMPLANT
PAD POSITIONING PINK XL (MISCELLANEOUS) IMPLANT
POSITIONER SURGICAL ARM (MISCELLANEOUS) IMPLANT
SCISSORS LAP 5X35 DISP (ENDOMECHANICALS) ×3 IMPLANT
SET IRRIG TUBING LAPAROSCOPIC (IRRIGATION / IRRIGATOR) ×2 IMPLANT
SHEARS HARMONIC ACE PLUS 36CM (ENDOMECHANICALS) ×2 IMPLANT
SLEEVE XCEL OPT CAN 5 100 (ENDOMECHANICALS) ×3 IMPLANT
STRIP CLOSURE SKIN 1/2X4 (GAUZE/BANDAGES/DRESSINGS) IMPLANT
SUT MNCRL AB 4-0 PS2 18 (SUTURE) ×3 IMPLANT
SUT NOVA NAB GS-21 0 18 T12 DT (SUTURE) ×3 IMPLANT
SUT NOVA NAB GS-21 1 T12 (SUTURE) ×2 IMPLANT
SUT NOVA T20/GS 25 (SUTURE) IMPLANT
SUT VIC AB 2-0 SH 18 (SUTURE) ×2 IMPLANT
TAPE CLOTH 4X10 WHT NS (GAUZE/BANDAGES/DRESSINGS) IMPLANT
TOWEL OR 17X26 10 PK STRL BLUE (TOWEL DISPOSABLE) ×3 IMPLANT
TOWEL OR NON WOVEN STRL DISP B (DISPOSABLE) ×3 IMPLANT
TRAY FOLEY W/METER SILVER 14FR (SET/KITS/TRAYS/PACK) IMPLANT
TRAY FOLEY W/METER SILVER 16FR (SET/KITS/TRAYS/PACK) IMPLANT
TRAY LAPAROSCOPIC (CUSTOM PROCEDURE TRAY) ×3 IMPLANT
TROCAR ADV FIXATION 5X100MM (TROCAR) ×2 IMPLANT
TROCAR BLADELESS OPT 5 100 (ENDOMECHANICALS) ×3 IMPLANT
TROCAR XCEL NON-BLD 11X100MML (ENDOMECHANICALS) ×3 IMPLANT

## 2016-04-22 NOTE — Anesthesia Procedure Notes (Signed)
Procedure Name: Intubation Date/Time: 04/22/2016 3:25 PM Performed by: Doran ClayALDAY, Vania Rosero R Pre-anesthesia Checklist: Patient identified, Emergency Drugs available, Suction available, Patient being monitored and Timeout performed Patient Re-evaluated:Patient Re-evaluated prior to inductionOxygen Delivery Method: Circle system utilized Preoxygenation: Pre-oxygenation with 100% oxygen Intubation Type: IV induction, Cricoid Pressure applied and Rapid sequence Laryngoscope Size: Mac and 4 Grade View: Grade I Tube type: Oral Tube size: 7.5 mm Number of attempts: 1 Airway Equipment and Method: Stylet Secured at: 23 cm Tube secured with: Tape Dental Injury: Teeth and Oropharynx as per pre-operative assessment

## 2016-04-22 NOTE — Progress Notes (Addendum)
[facility]:  [phone] General Surgery Progress Note   LOS: 2 days  POD -  2 Days Post-Op  Assessment/Plan: 1. LAPAROSCOPIC ROUX-EN-Y GASTRIC BYPASS WITH UPPER ENDOSCOPY - 04/20/2016 - D. Rhylan Kagel  For morbid obesity - weight 327, BMI of 45.7  More nausea/vomiting than expected yesterday - I am concerned about small bowel incarcerated in him umbilicus on my physical exam.  Will get abdominal CT scan to evaluate.  WBC up more than expected.  He has claustrophobia.  Discussed with patient.  1400: CT scan confirms incarcerated umbilical hernia.  Discussed with patient and wife.   To go to the OR today for umbilical hernia repair.  Possibility of small bowel resection explained .  2. SLEEP APNEA IN ADULT (G47.30) 3. ESSENTIAL HYPERTENSION (I10) 4. Back surgery in 2008 Still has some back spasms. He uses a mixture of meds - including Tramadol to control this. 5. Panic attacks This seems to happen every 2 to 4 weeks and has started occurring just recently.  6.   DVT prophylaxis - SQ Heparin  Active Problems:   Morbid obesity (HCC)   Subjective:  N&V yesterday - somewhat better today.  Passed flatus.  Objective:   Filed Vitals:   04/22/16 0124 04/22/16 0610  BP: 119/76 139/74  Pulse: 107 107  Temp: 98.2 F (36.8 C) 97.8 F (36.6 C)  Resp: 18 18     Intake/Output from previous day:  05/02 0701 - 05/03 0700 In: 3720 [P.O.:120; I.V.:3600] Out: 1600 [Urine:1175; Emesis/NG output:425]  Intake/Output this shift:      Physical Exam:   General: Obese WM who is alert and oriented.    HEENT: Normal. Pupils equal. .   Lungs: Clear   Abdomen: Soft.  Has active bowel sounds. Tenderness and fullness at umbilicus.   Wound: Clean   Lab Results:     Recent Labs  04/21/16 0424 04/21/16 1545 04/22/16 0428  WBC 13.5*  --  23.2*  HGB 13.8 15.7 16.3  HCT 40.7 46.2 46.9  PLT 261  --  320    BMET  No results for input(s): NA,  K, CL, CO2, GLUCOSE, BUN, CREATININE, CALCIUM in the last 72 hours.  PT/INR  No results for input(s): LABPROT, INR in the last 72 hours.  ABG  No results for input(s): PHART, HCO3 in the last 72 hours.  Invalid input(s): PCO2, PO2   Studies/Results:  Dg Ugi W/water Sol Cm  04/21/2016  CLINICAL DATA:  Status post Roux-en-Y gastric bypass. EXAM: WATER SOLUBLE UPPER GI SERIES TECHNIQUE: Single-column upper GI series was performed using water soluble contrast. CONTRAST:  01/23/2016 COMPARISON:  None. FLUOROSCOPY TIME:  Radiation Exposure Index (as provided by the fluoroscopic device): 61 mGy If the device does not provide the exposure index: Fluoroscopy Time (in minutes and seconds):  48 seconds Number of Acquired Images: FINDINGS: Patient ingested 50 cc of water-soluble contrast material. Upper GI track anatomy compatible with Roux-en-Y gastric bypass surgery noted. There is no extravasation of contrast material to suggest leakage. There is normal antegrade progression of the contrast material into the gastric remnant and proximal small bowel loops. IMPRESSION: 1. No complications identified status post Roux-en-Y gastric bypass surgery. Electronically Signed   By: Signa Kellaylor  Stroud M.D.   On: 04/21/2016 10:45     Anti-infectives:   Anti-infectives    Start     Dose/Rate Route Frequency Ordered Stop   04/20/16 0538  cefoTEtan in Dextrose 5% (CEFOTAN) IVPB 2 g  2 g Intravenous On call to O.R. 04/20/16 0538 04/20/16 0725      Ovidio Kin, MD, FACS Pager: [phone] Surgery Office: [phone] 04/22/2016

## 2016-04-22 NOTE — Anesthesia Postprocedure Evaluation (Signed)
Anesthesia Post Note  Patient: Anice PaganiniDavid A Nadal  Procedure(s) Performed: Procedure(s) (LRB): LAPAROSCOPIC UMBILICAL HERNIA (N/A)  Patient location during evaluation: PACU Anesthesia Type: General Level of consciousness: awake and alert Pain management: pain level controlled Vital Signs Assessment: post-procedure vital signs reviewed and stable Respiratory status: spontaneous breathing, nonlabored ventilation, respiratory function stable and patient connected to nasal cannula oxygen Cardiovascular status: blood pressure returned to baseline and stable Postop Assessment: no signs of nausea or vomiting Anesthetic complications: no    Last Vitals:  Filed Vitals:   04/22/16 1715 04/22/16 1730  BP: 137/85 152/98  Pulse: 108 102  Temp:    Resp: 19 15    Last Pain:  Filed Vitals:   04/22/16 1735  PainSc: 4                  Phillips Groutarignan, Kursten Kruk

## 2016-04-22 NOTE — Progress Notes (Signed)
Nutrition Education Note  Received consult for diet education per DROP protocol. Pt in room with RN at time of visit. Per RN, pt is not expected to discharge today and pt stated he did not want education at this time. Per surgery note, pt has had N/V. There is concern for an incarcerated small bowel.   RD will monitor for plan and provide education at a more appropriate time.  Tilda FrancoLindsey Jenaro Souder, MS, RD, LDN Pager: [phone] After Hours Pager: [phone]

## 2016-04-22 NOTE — Anesthesia Preprocedure Evaluation (Addendum)
Anesthesia Evaluation  Patient identified by MRN, date of birth, ID band Patient awake    Reviewed: Allergy & Precautions, NPO status , Patient's Chart, lab work & pertinent test results  Airway Mallampati: II  TM Distance: >3 FB Neck ROM: Full    Dental no notable dental hx.    Pulmonary sleep apnea and Continuous Positive Airway Pressure Ventilation ,    Pulmonary exam normal breath sounds clear to auscultation       Cardiovascular hypertension, Pt. on medications Normal cardiovascular exam Rhythm:Regular Rate:Normal     Neuro/Psych negative neurological ROS  negative psych ROS   GI/Hepatic negative GI ROS, Neg liver ROS,   Endo/Other  Morbid obesity  Renal/GU negative Renal ROS  negative genitourinary   Musculoskeletal negative musculoskeletal ROS (+)   Abdominal   Peds negative pediatric ROS (+)  Hematology negative hematology ROS (+)   Anesthesia Other Findings   Reproductive/Obstetrics negative OB ROS                            Anesthesia Physical Anesthesia Plan  ASA: III  Anesthesia Plan: General   Post-op Pain Management:    Induction: Intravenous, Rapid sequence and Cricoid pressure planned  Airway Management Planned: Oral ETT  Additional Equipment:   Intra-op Plan:   Post-operative Plan: Extubation in OR  Informed Consent: I have reviewed the patients History and Physical, chart, labs and discussed the procedure including the risks, benefits and alternatives for the proposed anesthesia with the patient or authorized representative who has indicated his/her understanding and acceptance.   Dental advisory given  Plan Discussed with: CRNA  Anesthesia Plan Comments:         Anesthesia Quick Evaluation

## 2016-04-22 NOTE — Transfer of Care (Signed)
Immediate Anesthesia Transfer of Care Note  Patient: Derrick [name]  Procedure(s) Performed: Procedure(s): LAPAROSCOPIC UMBILICAL HERNIA (N/A)  Patient Location: PACU  Anesthesia Type:General  Level of Consciousness: awake and alert   Airway & Oxygen Therapy: Patient Spontanous Breathing and Patient connected to face mask oxygen  Post-op Assessment: Report given to RN and Post -op Vital signs reviewed and stable  Post vital signs: Reviewed and stable  Last Vitals:  Filed Vitals:   04/22/16 1020 04/22/16 1414  BP: 142/84 145/81  Pulse: 101 110  Temp: 36.9 C 36.7 C  Resp: 18 18    Last Pain:  Filed Vitals:   04/22/16 1415  PainSc: 4       Patients Stated Pain Goal: 2 (04/22/16 1326)  Complications: No apparent anesthesia complications

## 2016-04-23 ENCOUNTER — Encounter (HOSPITAL_COMMUNITY): Payer: Self-pay | Admitting: Surgery

## 2016-04-23 LAB — CBC WITH DIFFERENTIAL/PLATELET
BASOS ABS: 0 10*3/uL (ref 0.0–0.1)
Basophils Relative: 0 %
EOS ABS: 0.1 10*3/uL (ref 0.0–0.7)
EOS PCT: 1 %
HCT: 39.5 % (ref 39.0–52.0)
Hemoglobin: 13.7 g/dL (ref 13.0–17.0)
Lymphocytes Relative: 14 %
Lymphs Abs: 1.7 10*3/uL (ref 0.7–4.0)
MCH: 30.9 pg (ref 26.0–34.0)
MCHC: 34.7 g/dL (ref 30.0–36.0)
MCV: 89 fL (ref 78.0–100.0)
MONO ABS: 1.4 10*3/uL — AB (ref 0.1–1.0)
Monocytes Relative: 11 %
Neutro Abs: 9.5 10*3/uL — ABNORMAL HIGH (ref 1.7–7.7)
Neutrophils Relative %: 74 %
PLATELETS: 211 10*3/uL (ref 150–400)
RBC: 4.44 MIL/uL (ref 4.22–5.81)
RDW: 14.2 % (ref 11.5–15.5)
WBC: 12.7 10*3/uL — ABNORMAL HIGH (ref 4.0–10.5)

## 2016-04-23 LAB — BASIC METABOLIC PANEL
ANION GAP: 10 (ref 5–15)
BUN: 13 mg/dL (ref 6–20)
CHLORIDE: 103 mmol/L (ref 101–111)
CO2: 24 mmol/L (ref 22–32)
Calcium: 8.3 mg/dL — ABNORMAL LOW (ref 8.9–10.3)
Creatinine, Ser: 1.24 mg/dL (ref 0.61–1.24)
GFR calc non Af Amer: >60 mL/min (ref >60)
Glucose, Bld: 114 mg/dL — ABNORMAL HIGH (ref 65–99)
POTASSIUM: 4.3 mmol/L (ref 3.5–5.1)
SODIUM: 137 mmol/L (ref 135–145)

## 2016-04-23 NOTE — Progress Notes (Signed)
[facility]:  [phone] General Surgery Progress Note   LOS: 3 days  POD -  1 Day Post-Op  Assessment/Plan: 1. LAPAROSCOPIC ROUX-EN-Y GASTRIC BYPASS WITH UPPER ENDOSCOPY - 04/20/2016 - D. Trevar Boehringer  For morbid obesity - weight 327, BMI of 45.7  Doing better, Restart diet  2.  Lap assisted repair of umbilical hernia - 04/22/2016 - D. Alice Vitelli  Some drainage from incision  3. SLEEP APNEA IN ADULT (G47.30) 4. ESSENTIAL HYPERTENSION (I10) 5. Back surgery in 2008 Still has some back spasms. He uses a mixture of meds - including Tramadol to control this. 6. Panic attacks This seems to happen every 2 to 4 weeks and has started occurring just recently.  7.   DVT prophylaxis - SQ Heparin  Active Problems:   Morbid obesity (HCC)   Subjective:  Feels better, passing flatus.  No nausea.  Objective:   Filed Vitals:   04/23/16 0208 04/23/16 0622  BP: 135/90 125/76  Pulse: 103 99  Temp: 98.4 F (36.9 C) 98.7 F (37.1 C)  Resp: 16 16     Intake/Output from previous day:  05/03 0701 - 05/04 0700 In: 4450 [I.V.:3950; IV Piggyback:500] Out: 475 [Urine:450; Blood:25]  Intake/Output this shift:      Physical Exam:   General: Obese WM who is alert and oriented.    HEENT: Normal. Pupils equal. .   Lungs: Clear   Abdomen: Soft.     Wound: Clean, umbilical incision draining some.   Lab Results:     Recent Labs  04/22/16 0428 04/23/16 0430  WBC 23.2* 12.7*  HGB 16.3 13.7  HCT 46.9 39.5  PLT 320 211    BMET    Recent Labs  04/23/16 0430  NA 137  K 4.3  CL 103  CO2 24  GLUCOSE 114*  BUN 13  CREATININE 1.24  CALCIUM 8.3*    PT/INR  No results for input(s): LABPROT, INR in the last 72 hours.  ABG  No results for input(s): PHART, HCO3 in the last 72 hours.  Invalid input(s): PCO2, PO2   Studies/Results:  Ct Abdomen W Contrast  04/22/2016  CLINICAL DATA:  Gastric bypass 04/20/2016 with nausea and vomiting. Possible  incarcerated small bowel within an umbilical hernia. EXAM: CT ABDOMEN WITH CONTRAST TECHNIQUE: Multidetector CT imaging of the abdomen was performed using the standard protocol following bolus administration of intravenous contrast. CONTRAST:  100mL ISOVUE-300 IOPAMIDOL (ISOVUE-300) INJECTION 61% COMPARISON:  None. FINDINGS: Lower chest: Lung bases show to subsegmental atelectasis in the left lower lobe. Heart size normal. No pericardial effusion. Pre pericardiac lymph nodes are sub cm in short axis size. Lower esophagus is dilated and contains oral contrast, which can be seen with dysmotility or reflux. No pleural effusion. Hepatobiliary: Liver and gallbladder are unremarkable. No biliary ductal dilatation. Pancreas: Negative. Spleen: Negative. Adrenals/Urinary Tract: Adrenal glands and kidneys are unremarkable. Stomach/Bowel: Gastric bypass. Small bowel is dilated to the level of a periumbilical hernia. There is decompression of small bowel as it exits the hernia but the visualized small bowel loops appear somewhat thickened. Small bowel is not visualized in its entirety, however. Visualized portion of the colon is unremarkable. Vascular/Lymphatic: Atherosclerotic calcification of the arterial vasculature without abdominal aortic aneurysm. No pathologically enlarged lymph nodes. Scattered small bowel mesenteric nodularity measures up to 11 mm, likely reactive. Other: Small bowel mesenteric haziness is seen. Small ascites. Fluid is seen within the periumbilical hernia, with a small locule of air, possibly postoperative in etiology. Musculoskeletal: No worrisome  lytic or sclerotic lesions. Degenerative changes are seen in the spine. Body wall edema along the left anterolateral abdominal wall, with residual subcutaneous emphysema. IMPRESSION: 1. Small bowel obstruction secondary to an umbilical hernia. Mild associated mesenteric edema and thickening of distal small bowel loops. Impending ischemia cannot be excluded.  These results were called by telephone at the time of interpretation on 04/22/2016 at 1:50 pm to Dr. Ovidio Kin , who verbally acknowledged these results. 2. Small ascites. Electronically Signed   By: Leanna Battles M.D.   On: 04/22/2016 13:57   Dg Kayleen Memos W/water Sol Cm  04/21/2016  CLINICAL DATA:  Status post Roux-en-Y gastric bypass. EXAM: WATER SOLUBLE UPPER GI SERIES TECHNIQUE: Single-column upper GI series was performed using water soluble contrast. CONTRAST:  01/23/2016 COMPARISON:  None. FLUOROSCOPY TIME:  Radiation Exposure Index (as provided by the fluoroscopic device): 61 mGy If the device does not provide the exposure index: Fluoroscopy Time (in minutes and seconds):  48 seconds Number of Acquired Images: FINDINGS: Patient ingested 50 cc of water-soluble contrast material. Upper GI track anatomy compatible with Roux-en-Y gastric bypass surgery noted. There is no extravasation of contrast material to suggest leakage. There is normal antegrade progression of the contrast material into the gastric remnant and proximal small bowel loops. IMPRESSION: 1. No complications identified status post Roux-en-Y gastric bypass surgery. Electronically Signed   By: Signa Kell M.D.   On: 04/21/2016 10:45     Anti-infectives:   Anti-infectives    Start     Dose/Rate Route Frequency Ordered Stop   04/23/16 0400  cefoTEtan (CEFOTAN) 2 g in dextrose 5 % 50 mL IVPB     2 g 100 mL/hr over 30 Minutes Intravenous Every 12 hours 04/22/16 1538     04/22/16 1530  cefoTEtan (CEFOTAN) 2 g in dextrose 5 % 50 mL IVPB  Status:  Discontinued     2 g 100 mL/hr over 30 Minutes Intravenous Every 12 hours 04/22/16 1529 04/22/16 1538   04/20/16 0538  cefoTEtan in Dextrose 5% (CEFOTAN) IVPB 2 g     2 g Intravenous On call to O.R. 04/20/16 0538 04/20/16 0725      Ovidio Kin, MD, FACS Pager: [phone] [facility]: [phone] 04/23/2016

## 2016-04-23 NOTE — Progress Notes (Signed)
Patient alert and oriented, Post-op Day 3 bariatric surgery, Post op day 1 hernia repair.  Provided support and encouragement.  Encouraged pulmonary toilet, ambulation and small sips of liquids.  All questions answered.  Will continue to monitor.

## 2016-04-23 NOTE — Progress Notes (Signed)
Pt states he will place himself on CPAP when he is ready.  Pt encouraged to call if he needs assistance.

## 2016-04-23 NOTE — Op Note (Signed)
NAME:  Derrick [name], MCVOY NO.:  [id number]  MEDICAL RECORD NO.:  [id number]  LOCATION:  1540                         FACILITY:  [facility]  PHYSICIAN:  Sandria Bales. Ezzard Standing, M.D.  DATE OF BIRTH:  June 08, 1956  DATE OF PROCEDURE:  04/22/2016                              OPERATIVE REPORT   PREOPERATIVE DIAGNOSIS:  Incarcerated umbilical hernia.  POSTOPERATIVE DIAGNOSIS:  Incarcerated umbilical hernia.  PROCEDURE:  Laparoscopic-assisted umbilical hernia repair.  SURGEON:  Sandria Bales. Ezzard Standing, M.D.  FIRST ASSISTANT:  None.  ANESTHESIA:  General endotracheal, supervised Dr. Phillips Grout.  ESTIMATED BLOOD LOSS:  Minimal.  Local anesthetic was 25 mL of 0.25% Marcaine with epinephrine.  Counts were correct.  INDICATION FOR PROCEDURE:  Mr. Cantarella is a 60 year old white male, who is a patient of Dr. Elias Else, who had a laparoscopic Roux-en-Y gastric bypass on the Apr 20, 2016.  At that time, he was noted to have a small umbilical hernia with incarcerated omentum.  I had to reduce the omentum to do perform his bypass procedure, but I thought the hernia was so small that small bowel could not get caught up in this.   Unfortunately, today on the 2nd postop day, he has had nausea, vomiting, and tenderness around his umbilicus and a CT scan revealed incarcerated small bowel in the umbilical hernia.  I discussed with him about proceeding with laparoscopic-assisted umbilical hernia repair and he comes back to the operating room for this exploration.  I discussed with him the potential complications including bleeding, infection, the need for bowel resection, and his length of stay dictated by what we find.  OPERATIVE NOTE:  The patient was placed in a supine position in room #4 at [facility], underwent a general anesthesia, given 2 g of cefotetan.  First, I had to take glue off the previous trocar wounds which we abraded some of the skin. We painted the skin with ChloraPrep and sterilely  draped him.  A time-out was held and surgical checklist run.  I accessed his abdominal cavity through the left upper quadrant trocar site from the prior surgery. I could almost put my finger through into the peritoneal cavity in the trocar, I used an 11 mm which went in without difficulty and insufflated the abdomen.  I could then see the small bowel incarcerated in the umbilical hernia.  With gentle pulling, I was able to reduce this small bowel into the abdomen.  At first, the small bowel looked beat up and hemorrhagic, but during the course of the next 30 minutes of closing the umbilical hernia, the small bowel looked better, only red and hemorrhagic. I thought the bowel looked viable and I did not think it need to be resected.  I did take pictures of the bowel and put these in the chart.  I did place the 2nd trocar of 5 mm in the left lateral abdomen to help me in reducing this hernia.  I then made an infraumbilical incision and cut down and excised the sac which was probably about 7 cm long, but it went down to a neck of about 2 cm, which was the defect in the  fascia. I amputated the sac, I sewed the base the of the sac with 2-0 Vicryl sutures.  I then cleaned up around the edges of the fascia and placed #1 Novafil sutures for the primary closure.  I closed the hernia with about five #1 Novafil sutures.  I then, back in the abdomen, reinspected again the small bowel.  I thought the small bowel looked good.  I irrigated the small bowel with saline and then removed the trocars in turn.  There was no bleeding at trocar site.  I irrigated out the umbilical wound with 1 L of saline.  I then closed in layers with 2-0 Vicryl sutures in deep and 4-0 Monocryl on the skin.  I closed the other 2 trocar sites that I opened with 4-0 Monocryl.  Also, all the wounds were cleaned and then painted with LiquiBand again.  Sponge and needle count were correct at the end of the case.  The patient tolerated procedure well,  was transferred to the recovery room in good condition.  Sponge and needle count were correct at the end of the case.  I did send the hernia sac off to pathology and I will keep him overnight n.p.o. and then go from there.   Sandria Bales. Ezzard Standing, M.D., [place], scribe for Epic   DHN/MEDQ  D:  04/22/2016  T:  04/23/2016  Job:  [id number]  cc:   Molly Maduro A. Nicholos Johns, M.D. Fax: [phone]

## 2016-04-24 LAB — CBC WITH DIFFERENTIAL/PLATELET
BASOS ABS: 0 10*3/uL (ref 0.0–0.1)
Basophils Relative: 0 %
EOS PCT: 2 %
Eosinophils Absolute: 0.2 10*3/uL (ref 0.0–0.7)
HEMATOCRIT: 36.3 % — AB (ref 39.0–52.0)
Hemoglobin: 12.5 g/dL — ABNORMAL LOW (ref 13.0–17.0)
LYMPHS ABS: 1.4 10*3/uL (ref 0.7–4.0)
LYMPHS PCT: 15 %
MCH: 30.5 pg (ref 26.0–34.0)
MCHC: 34.4 g/dL (ref 30.0–36.0)
MCV: 88.5 fL (ref 78.0–100.0)
MONO ABS: 0.9 10*3/uL (ref 0.1–1.0)
MONOS PCT: 9 %
NEUTROS ABS: 7 10*3/uL (ref 1.7–7.7)
Neutrophils Relative %: 74 %
PLATELETS: 196 10*3/uL (ref 150–400)
RBC: 4.1 MIL/uL — ABNORMAL LOW (ref 4.22–5.81)
RDW: 13.9 % (ref 11.5–15.5)
WBC: 9.5 10*3/uL (ref 4.0–10.5)

## 2016-04-24 NOTE — Progress Notes (Signed)
Discharge instructions given to patient questions answered 

## 2016-04-24 NOTE — Discharge Summary (Addendum)
Physician Discharge Summary  Patient ID:  Derrick [name]  MRN: [id number]  DOB/AGE: 24-May-1956 60 y.o.  Admit date: 04/20/2016 Discharge date: 04/24/2016  Discharge Diagnoses:  1. Morbid obesity - weight 327, BMI of 45.7  2. Incarcerated umbilical hernia on post op day #2 3. SLEEP APNEA IN ADULT (G47.30) 4. ESSENTIAL HYPERTENSION (I10) 5. Back surgery in 2008 Still has some back spasms. He uses a mixture of meds - including Tramadol to control this. 6. Panic attacks This seems to happen every 2 to 4 weeks and has started occurring just recently.    Active Problems:   Morbid obesity (HCC)   Operation: Procedure(s): 1. LAPAROSCOPIC ROUX-EN-Y GASTRIC BYPASS WITH UPPER ENDOSCOPY - 04/20/2016 - D. Leeana Creer 2.  LAPAROSCOPIC assisted UMBILICAL HERNIA repair (no mesh) on 04/22/2016 - D. Ezzard Standing  Discharged Condition: good  Hospital Course: Derrick [name] is an 60 y.o. male whose primary care physician is Lolita Patella, MD and who was admitted 04/20/2016 with a chief complaint of morbid obesity.   He was brought to the operating room on 04/20/2016 and underwent  LAPAROSCOPIC ROUX-EN-Y GASTRIC BYPASS WITH UPPER ENDOSCOPY.   He struggled with nausea on POD#1.  His UGI showed normal post op anatomy.  By post op day #2, he had periumbilical pain.  A abdominal CT scan revealed an incarcerated umbilical hernia.  He returned to the OR for LAPAROSCOPIC assisted UMBILICAL HERNIA repair.  He is now 4 days post op from the gastric bypass and 2 days post op from the umbilical hernia repair.  He is eating well, his WBC is normal, and he is ready to go home. He has had minimal drainage from the umbilical wound which I think is draining serous fluid. He is ready to go home.  The discharge instructions were reviewed with the patient.  Consults: None  Significant Diagnostic Studies: Results for orders placed or performed during the hospital encounter of 04/20/16  Hemoglobin and  hematocrit, blood  Result Value Ref Range   Hemoglobin 14.4 13.0 - 17.0 g/dL   HCT 29.5 28.4 - 13.2 %  CBC WITH DIFFERENTIAL  Result Value Ref Range   WBC 13.5 (H) 4.0 - 10.5 K/uL   RBC 4.60 4.22 - 5.81 MIL/uL   Hemoglobin 13.8 13.0 - 17.0 g/dL   HCT 44.0 10.2 - 72.5 %   MCV 88.5 78.0 - 100.0 fL   MCH 30.0 26.0 - 34.0 pg   MCHC 33.9 30.0 - 36.0 g/dL   RDW 36.6 44.0 - 34.7 %   Platelets 261 150 - 400 K/uL   Neutrophils Relative % 84 %   Neutro Abs 11.3 (H) 1.7 - 7.7 K/uL   Lymphocytes Relative 7 %   Lymphs Abs 1.0 0.7 - 4.0 K/uL   Monocytes Relative 9 %   Monocytes Absolute 1.2 (H) 0.1 - 1.0 K/uL   Eosinophils Relative 0 %   Eosinophils Absolute 0.0 0.0 - 0.7 K/uL   Basophils Relative 0 %   Basophils Absolute 0.0 0.0 - 0.1 K/uL  Hemoglobin and hematocrit, blood  Result Value Ref Range   Hemoglobin 15.7 13.0 - 17.0 g/dL   HCT 42.5 95.6 - 38.7 %  CBC with Differential  Result Value Ref Range   WBC 23.2 (H) 4.0 - 10.5 K/uL   RBC 5.29 4.22 - 5.81 MIL/uL   Hemoglobin 16.3 13.0 - 17.0 g/dL   HCT 56.4 33.2 - 95.1 %   MCV 88.7 78.0 - 100.0 fL   MCH 30.8 26.0 -  34.0 pg   MCHC 34.8 30.0 - 36.0 g/dL   RDW 16.1 09.6 - 04.5 %   Platelets 320 150 - 400 K/uL   Neutrophils Relative % 84 %   Neutro Abs 19.6 (H) 1.7 - 7.7 K/uL   Lymphocytes Relative 7 %   Lymphs Abs 1.6 0.7 - 4.0 K/uL   Monocytes Relative 9 %   Monocytes Absolute 2.0 (H) 0.1 - 1.0 K/uL   Eosinophils Relative 0 %   Eosinophils Absolute 0.0 0.0 - 0.7 K/uL   Basophils Relative 0 %   Basophils Absolute 0.0 0.0 - 0.1 K/uL  CBC with Differential/Platelet  Result Value Ref Range   WBC 12.7 (H) 4.0 - 10.5 K/uL   RBC 4.44 4.22 - 5.81 MIL/uL   Hemoglobin 13.7 13.0 - 17.0 g/dL   HCT 40.9 81.1 - 91.4 %   MCV 89.0 78.0 - 100.0 fL   MCH 30.9 26.0 - 34.0 pg   MCHC 34.7 30.0 - 36.0 g/dL   RDW 78.2 95.6 - 21.3 %   Platelets 211 150 - 400 K/uL   Neutrophils Relative % 74 %   Neutro Abs 9.5 (H) 1.7 - 7.7 K/uL   Lymphocytes  Relative 14 %   Lymphs Abs 1.7 0.7 - 4.0 K/uL   Monocytes Relative 11 %   Monocytes Absolute 1.4 (H) 0.1 - 1.0 K/uL   Eosinophils Relative 1 %   Eosinophils Absolute 0.1 0.0 - 0.7 K/uL   Basophils Relative 0 %   Basophils Absolute 0.0 0.0 - 0.1 K/uL  Basic metabolic panel  Result Value Ref Range   Sodium 137 135 - 145 mmol/L   Potassium 4.3 3.5 - 5.1 mmol/L   Chloride 103 101 - 111 mmol/L   CO2 24 22 - 32 mmol/L   Glucose, Bld 114 (H) 65 - 99 mg/dL   BUN 13 6 - 20 mg/dL   Creatinine, Ser 0.86 0.61 - 1.24 mg/dL   Calcium 8.3 (L) 8.9 - 10.3 mg/dL   GFR calc non Af Amer >60 >60 mL/min   GFR calc Af Amer >60 >60 mL/min   Anion gap 10 5 - 15  CBC with Differential/Platelet  Result Value Ref Range   WBC 9.5 4.0 - 10.5 K/uL   RBC 4.10 (L) 4.22 - 5.81 MIL/uL   Hemoglobin 12.5 (L) 13.0 - 17.0 g/dL   HCT 57.8 (L) 46.9 - 62.9 %   MCV 88.5 78.0 - 100.0 fL   MCH 30.5 26.0 - 34.0 pg   MCHC 34.4 30.0 - 36.0 g/dL   RDW 52.8 41.3 - 24.4 %   Platelets 196 150 - 400 K/uL   Neutrophils Relative % 74 %   Neutro Abs 7.0 1.7 - 7.7 K/uL   Lymphocytes Relative 15 %   Lymphs Abs 1.4 0.7 - 4.0 K/uL   Monocytes Relative 9 %   Monocytes Absolute 0.9 0.1 - 1.0 K/uL   Eosinophils Relative 2 %   Eosinophils Absolute 0.2 0.0 - 0.7 K/uL   Basophils Relative 0 %   Basophils Absolute 0.0 0.0 - 0.1 K/uL    Ct Abdomen W Contrast  04/22/2016  CLINICAL DATA:  Gastric bypass 04/20/2016 with nausea and vomiting. Possible incarcerated small bowel within an umbilical hernia. EXAM: CT ABDOMEN WITH CONTRAST TECHNIQUE: Multidetector CT imaging of the abdomen was performed using the standard protocol following bolus administration of intravenous contrast. CONTRAST:  ISOVUE-300 IOPAMIDOL (ISOVUE-300) INJECTION 61% COMPARISON:  None. FINDINGS: Lower chest: Lung bases show to subsegmental  atelectasis in the left lower lobe. Heart size normal. No pericardial effusion. Pre pericardiac lymph nodes are sub cm in short  axis size. Lower esophagus is dilated and contains oral contrast, which can be seen with dysmotility or reflux. No pleural effusion. Hepatobiliary: Liver and gallbladder are unremarkable. No biliary ductal dilatation. Pancreas: Negative. Spleen: Negative. Adrenals/Urinary Tract: Adrenal glands and kidneys are unremarkable. Stomach/Bowel: Gastric bypass. Small bowel is dilated to the level of a periumbilical hernia. There is decompression of small bowel as it exits the hernia but the visualized small bowel loops appear somewhat thickened. Small bowel is not visualized in its entirety, however. Visualized portion of the colon is unremarkable. Vascular/Lymphatic: Atherosclerotic calcification of the arterial vasculature without abdominal aortic aneurysm. No pathologically enlarged lymph nodes. Scattered small bowel mesenteric nodularity measures up to 11 mm, likely reactive. Other: Small bowel mesenteric haziness is seen. Small ascites. Fluid is seen within the periumbilical hernia, with a small locule of air, possibly postoperative in etiology. Musculoskeletal: No worrisome lytic or sclerotic lesions. Degenerative changes are seen in the spine. Body wall edema along the left anterolateral abdominal wall, with residual subcutaneous emphysema. IMPRESSION: 1. Small bowel obstruction secondary to an umbilical hernia. Mild associated mesenteric edema and thickening of distal small bowel loops. Impending ischemia cannot be excluded. These results were called by telephone at the time of interpretation on 04/22/2016 at 1:50 pm to Dr. Ovidio Kin , who verbally acknowledged these results. 2. Small ascites. Electronically Signed   By: Leanna Battles M.D.   On: 04/22/2016 13:57   Dg Kayleen Memos W/water Sol Cm  04/21/2016  CLINICAL DATA:  Status post Roux-en-Y gastric bypass. EXAM: WATER SOLUBLE UPPER GI SERIES TECHNIQUE: Single-column upper GI series was performed using water soluble contrast. CONTRAST:  01/23/2016 COMPARISON:  None.  FLUOROSCOPY TIME:  Radiation Exposure Index (as provided by the fluoroscopic device): 61 mGy If the device does not provide the exposure index: Fluoroscopy Time (in minutes and seconds):  48 seconds Number of Acquired Images: FINDINGS: Patient ingested 50 cc of water-soluble contrast material. Upper GI track anatomy compatible with Roux-en-Y gastric bypass surgery noted. There is no extravasation of contrast material to suggest leakage. There is normal antegrade progression of the contrast material into the gastric remnant and proximal small bowel loops. IMPRESSION: 1. No complications identified status post Roux-en-Y gastric bypass surgery. Electronically Signed   By: Signa Kell M.D.   On: 04/21/2016 10:45    Discharge Exam:  Filed Vitals:   04/23/16 2118 04/24/16 0542  BP: 121/77 126/73  Pulse: 97 89  Temp: 98.3 F (36.8 C) 98.2 F (36.8 C)  Resp: 16 16    General: Obese WF who is alert and generally healthy appearing.  Lungs: Clear to auscultation and symmetric breath sounds. Heart:  RRR. No murmur or rub. Abdomen: Soft. No mass. No hernia. Normal bowel sounds.  Some drainage from umbilical wound.    Discharge Medications:     Medication List    TAKE these medications        acetaminophen 500 MG tablet  Commonly known as:  TYLENOL  Take 500 mg by mouth 3 (three) times daily.     aspirin EC 81 MG tablet  Take 81 mg by mouth at bedtime.  Notes to Patient:  Avoid NSAIDs for 6-8 weeks after surgery      BENADRYL 25 MG tablet  Generic drug:  diphenhydrAMINE  Take 50 mg by mouth every 6 (six) hours as needed for itching.  cyclobenzaprine 10 MG tablet  Commonly known as:  FLEXERIL  Take 1 tablet (10 mg total) by mouth 2 (two) times daily.     lisinopril 20 MG tablet  Commonly known as:  PRINIVIL,ZESTRIL  Take 20 mg by mouth at bedtime.  Notes to Patient:  Monitor Blood Pressure Daily and keep a log for primary care physician.  You may need to make changes to your  medications with rapid weight loss.        loratadine 10 MG tablet  Commonly known as:  CLARITIN  Take 10 mg by mouth daily as needed for allergies.     multivitamin tablet  Take 1 tablet by mouth daily.     naproxen sodium 220 MG tablet  Commonly known as:  ANAPROX  Take 220 mg by mouth 2 (two) times daily as needed (pain).  Notes to Patient:  Avoid NSAIDs for 6-8 weeks after surgery      oxyCODONE-acetaminophen 5-325 MG tablet  Commonly known as:  ROXICET  Take one tablet by mouth every 3 hours as needed for mild to moderate pain; Take two tablets by mouth every 3 hours as needed for severe pain     simvastatin 20 MG tablet  Commonly known as:  ZOCOR  Take 20 mg by mouth every other day.     traMADol 50 MG tablet  Commonly known as:  ULTRAM  Take one tablet by mouth twice daily for pain        Disposition: 03-Skilled Nursing Facility      Discharge Instructions    Ambulate hourly while awake    Complete by:  As directed      Call MD for:  difficulty breathing, headache or visual disturbances    Complete by:  As directed      Call MD for:  persistant dizziness or light-headedness    Complete by:  As directed      Call MD for:  persistant nausea and vomiting    Complete by:  As directed      Call MD for:  redness, tenderness, or signs of infection (pain, swelling, redness, odor or green/yellow discharge around incision site)    Complete by:  As directed      Call MD for:  severe uncontrolled pain    Complete by:  As directed      Call MD for:  temperature >101 F    Complete by:  As directed      Diet bariatric full liquid    Complete by:  As directed      Incentive spirometry    Complete by:  As directed   Perform hourly while awake           Follow-up Information    Follow up with [facility] H, MD. Go on 05/06/2016.   Specialty:  General Surgery   Why:  For Post-Op Check at 9:15 AM   Contact information:   [address] [place]  [address] [phone]       Follow up with [facility] H, MD. Go on 05/29/2016.   Specialty:  General Surgery   Why:  For Post-Op Check at 8:30 AM   Contact information:   [address] [place] [address] [phone]        Signed: Ovidio Kin, M.D., [facility] Office:  [phone]  04/24/2016, 7:52 AM

## 2016-04-24 NOTE — Progress Notes (Signed)
Patient alert and oriented, pain is controlled. Patient is tolerating fluids, advanced to protein shake yesterday and patient has tolerated well. Reviewed Gastric Bypass discharge instructions with patient and patient is able to articulate understanding. Provided information on BELT program, Support Group and [facility] outpatient pharmacy. All questions answered, will continue to monitor.

## 2016-05-01 ENCOUNTER — Telehealth (HOSPITAL_COMMUNITY): Payer: Self-pay

## 2016-05-01 NOTE — Telephone Encounter (Signed)
Attempted DC call, no answer, left message to return call   Made discharge phone call to patient per DROP protocol. Asking the following questions.    1. Do you have someone to care for you now that you are home?   2. Are you having pain now that is not relieved by your pain medication?   3. Are you able to drink the recommended daily amount of fluids (48 ounces minimum/day) and protein (60-80 grams/day) as prescribed by the dietitian or nutritional counselor?   4. Are you taking the vitamins and minerals as prescribed?   5. Do you have the "on call" number to contact your surgeon if you have a problem or question?   6. Are your incisions free of redness, swelling or drainage? (If steri strips, address that these can fall off, shower as tolerated)  7. Have your bowels moved since your surgery?  If not, are you passing gas?   8. Are you up and walking 3-4 times per day?       

## 2016-05-05 ENCOUNTER — Encounter: Payer: BLUE CROSS/BLUE SHIELD | Attending: Surgery

## 2016-05-05 DIAGNOSIS — Z029 Encounter for administrative examinations, unspecified: Secondary | ICD-10-CM | POA: Insufficient documentation

## 2016-05-05 NOTE — Progress Notes (Signed)
Bariatric Class:  Appt start time: 1530 end time:  1630.  2 Week Post-Operative Nutrition Class  Patient was seen on 05/05/2016 for Post-Operative Nutrition [facility].   Surgery date: 04/20/2016 Surgery type: RYGB Start weight at [facility]: 334.5 lbs on 07/02/2015 Weight today: 311.6 lbs  Weight change: 24.4 lbs  TANITA  BODY COMP RESULTS  03/30/16 05/05/16   BMI (kg/m^2) 46.9 43.5   Fat Mass (lbs) 171 131.8   Fat Free Mass (lbs) 165 179.8   Total Body Water (lbs) 121 137.4    The following the learning objectives were met by the patient during this course:  Identifies Phase 3A (Soft, High Proteins) Dietary Goals and will begin from 2 weeks post-operatively to 2 months post-operatively  Identifies appropriate sources of fluids and proteins   States protein recommendations and appropriate sources post-operatively  Identifies the need for appropriate texture modifications, mastication, and bite sizes when consuming solids  Identifies appropriate multivitamin and calcium sources post-operatively  Describes the need for physical activity post-operatively and will follow MD recommendations  States when to call healthcare provider regarding medication questions or post-operative complications  Handouts given during class include:  Phase 3A: Soft, High Protein Diet Handout  Follow-Up Plan: Patient will follow-up at [facility] in 6 weeks for 2 month post-op nutrition visit for diet advancement per MD.

## 2016-06-18 ENCOUNTER — Encounter: Payer: Self-pay | Admitting: Dietician

## 2016-06-18 ENCOUNTER — Encounter: Payer: BLUE CROSS/BLUE SHIELD | Attending: Surgery | Admitting: Dietician

## 2016-06-18 DIAGNOSIS — Z029 Encounter for administrative examinations, unspecified: Secondary | ICD-10-CM | POA: Insufficient documentation

## 2016-06-18 NOTE — Progress Notes (Signed)
  Follow-up visit:  8 Weeks Post-Operative RYGB Surgery  Medical Nutrition Therapy:  Appt start time: 330 end time:  400  Primary concerns today: Post-operative Bariatric Surgery Nutrition Management. Derrick [name] returns today having lost a total of 52 pounds. He states that he is feeling well. He states that he feels like he is in a ketogenic state. Reports that foods do not taste as good. Using My Fitness Pal to monitor intake. "Fullness indicator is getting better." Has tried some bread and vegetables and tolerates. Blood pressure has been very good! Feels like he needs to do more exercise.  Surgery date: 04/20/2016 Surgery type: RYGB [facility] lbs on 07/02/2015 Weight today: 284.2 lbs Weight change: 27.4 lbs Total weight lost: 52 lbs Weight loss goal: 185 lbs  TANITA  BODY COMP RESULTS  03/30/16 05/05/16 06/18/16   BMI (kg/m^2) 46.9 43.5 39.6   Fat Mass (lbs) 171 131.8 130.2   Fat Free Mass (lbs) 165 179.8 154   Total Body Water (lbs) 121 137.4 113.2    Preferred Learning Style:  No preference indicated   Learning Readiness:   Ready  24-hr recall: B (7AM): [place] yogurt (15g) Snk (10AM): Premier protein shake (30g)  L (PM): 1/2 cup egg/tuna/chicken salad on 1 piece of wheat bread OR  2 slices of [place]key and 1 slice cheese (21g) Snk (3PM): Premier protein shake (30g) D (PM): [place] steak and macaroni and cheese Lean Cuisine OR meatballs Snk (PM): 1 Tbsp peanut butter  Fluid intake: decaf coffee with sugar free creamer, 50-60 oz water with sugar free flavoring, 22 oz protein shake  Estimated total protein intake: 90-100 grams per day per patient (just under 900 calories per day)  Medications: see list; no longer taking Lisinopril Supplementation: taking   Drinking while eating: no Hair loss: no Carbonated beverages: none N/V/D/C: 1 episode of vomiting since surgery; a little constipation Dumping syndrome: none  Recent physical activity:  30 minutes on  recumbent bike 1-2x a week  Progress Towards Goal(s):  In progress.  Handouts given during visit include:  Phase 3B lean protein + non starchy vegetables   Nutritional Diagnosis:  [place]-3.3 Overweight/obesity related to past poor dietary habits and physical inactivity as evidenced by patient w/ recent RYGB surgery following dietary guidelines for continued weight loss.     Intervention:  Nutrition counseling provided.  Teaching Method Utilized:  Visual Auditory Hands on  Barriers to learning/adherence to lifestyle change: none  Demonstrated degree of understanding via:  Teach Back   Monitoring/Evaluation:  Dietary intake, exercise, and body weight. Follow up in 2 months for 4 month post-op visit.

## 2016-06-18 NOTE — Patient Instructions (Addendum)
Goals:  Follow Phase 3B: High Protein + Non-Starchy Vegetables  Eat 3-6 small meals/snacks, every 3-5 hrs  Increase lean protein foods to meet 80g goal  Increase fluid intake to 64oz +  Avoid drinking 15 minutes before, during and 30 minutes after eating  Aim for >30 min of physical activity daily  Eat protein first, then vegetables, then starch  Aim for 15 grams of carbs 4x a day   Surgery date: 04/20/2016 Surgery type: [facility]YGB [facility] lbs on 07/02/2015 Weight today: 284.2 lbs Weight change: 27.4 lbs Total weight lost: 52 lbs

## 2016-06-19 ENCOUNTER — Encounter: Payer: Self-pay | Admitting: Dietician

## 2016-08-20 ENCOUNTER — Ambulatory Visit: Payer: Self-pay | Admitting: Dietician

## 2016-11-23 ENCOUNTER — Encounter: Payer: BLUE CROSS/BLUE SHIELD | Attending: Surgery | Admitting: Dietician

## 2016-11-23 ENCOUNTER — Encounter: Payer: Self-pay | Admitting: Dietician

## 2016-11-23 DIAGNOSIS — Z713 Dietary counseling and surveillance: Secondary | ICD-10-CM | POA: Insufficient documentation

## 2016-11-23 DIAGNOSIS — E669 Obesity, unspecified: Secondary | ICD-10-CM | POA: Insufficient documentation

## 2016-11-23 NOTE — Patient Instructions (Addendum)
Goals:  Follow Phase 3B: High Protein + Non-Starchy Vegetables  Eat 3-6 small meals/snacks, every 3-5 hrs  Increase lean protein foods to meet 80g goal  Increase fluid intake to 64oz +  Avoid drinking 15 minutes before, during and 30 minutes after eating  Aim for >30 min of physical activity daily  Eat protein first, then vegetables, then starch  Include carbs when you feel like you need  Surgery date: 04/20/2016 Surgery type: RYGB [facility] lbs on 07/02/2015 Weight today: 284.2 lbs Weight change: 80 lbs Total weight lost: 104 lbs Weight loss goal: 185 lbs  TANITA  BODY COMP RESULTS  03/30/16 05/05/16 06/18/16 11/23/16   BMI (kg/m^2) 46.9 43.5 39.6 32.1   Fat Mass (lbs) 171 131.8 130.2 56.6   Fat Free Mass (lbs) 165 179.8 154 173.8   Total Body Water (lbs) 121 137.4 113.2 120.2

## 2016-11-23 NOTE — Progress Notes (Signed)
  Follow-up visit:  7 months Post-Operative RYGB Surgery  Medical Nutrition Therapy:  Appt start time: 1110 end time:  1150  Primary concerns today: Post-operative Bariatric Surgery Nutrition Management. Derrick [name] returns today having lost a total of 104 pounds. He continues to track foods on My Fitness Pal. Aims for 95 grams of protein each day and up to 1000 calories per day. Still feeling "queasy" when he eats meat, especially chicken. Feels better after eating sugar free popsicles. Not having any vomiting. Has been hiking on the weekends, up to 7 miles at a time! Had bilateral knee replacements and not having pain. Excited to be able to do more activities. He has been having more carbs lately and wants to know if this is okay.    Surgery date: 04/20/2016 Surgery type: RYGB [facility] lbs on 07/02/2015 Weight today: 284.2 lbs Weight change: 80 lbs Total weight lost: 104 lbs Weight loss goal: 185 lbs  TANITA  BODY COMP RESULTS  03/30/16 05/05/16 06/18/16 11/23/16   BMI (kg/m^2) 46.9 43.5 39.6 32.1   Fat Mass (lbs) 171 131.8 130.2 56.6   Fat Free Mass (lbs) 165 179.8 154 173.8   Total Body Water (lbs) 121 137.4 113.2 120.2    Preferred Learning Style:  No preference indicated   Learning Readiness:   Ready  24-hr recall: B (7AM): decaf or regular coffee with sugar free creamer, [place] yogurt (15g) Snk (10AM): Premier protein shake (30g)  L (PM): soup or 1/2 sandwich Snk (3PM): Premier protein shake (30g) D (PM): salad with chicken  Snk (PM): banana chips and homemade granola  Fluid intake: decaf coffee with sugar free creamer, 50-60 oz water with sugar free flavoring, 22 oz protein shake  Estimated total protein intake: 90-125 grams a day per patient  Medications: see list; no longer taking Lisinopril Supplementation: taking   Drinking while eating: no Hair loss: no Carbonated beverages: none N/V/D/C: 1 episode of vomiting since surgery; a little  constipation Dumping syndrome: none  Recent physical activity:  Hiking on the weekends; patient plans to increase activity during the week  Progress Towards Goal(s):  In progress.  Handouts given during visit include:  none   Nutritional Diagnosis:  [place]-3.3 Overweight/obesity related to past poor dietary habits and physical inactivity as evidenced by patient w/ recent RYGB surgery following dietary guidelines for continued weight loss.     Intervention:  Nutrition counseling provided.  Teaching Method Utilized:  Visual Auditory Hands on  Barriers to learning/adherence to lifestyle change: none  Demonstrated degree of understanding via:  Teach Back   Monitoring/Evaluation:  Dietary intake, exercise, and body weight. Follow up in 2 months for 4 month post-op visit.

## 2018-01-25 IMAGING — CT CT ABDOMEN W/ CM
3 of 6 series · 16 of 46 positions shown, 18 images · IV contrast (iopamidol)
Comparison: None.

CLINICAL DATA: Gastric bypass 04/20/2016 with nausea and vomiting.
Possible incarcerated small bowel within an umbilical hernia.

EXAM:
CT ABDOMEN WITH CONTRAST
TECHNIQUE: Multidetector CT imaging of the abdomen was performed using the
standard protocol following bolus administration of intravenous
contrast.
CONTRAST:  100mL FS0VPN-HDD IOPAMIDOL (FS0VPN-HDD) INJECTION 61%

[Series 2: rtn a/p with · axial · 0.98mm/px · z∈[-188,+52]mm · 9 of 62 slices shown, 11 images]
[im 7/62  soft-tissue]
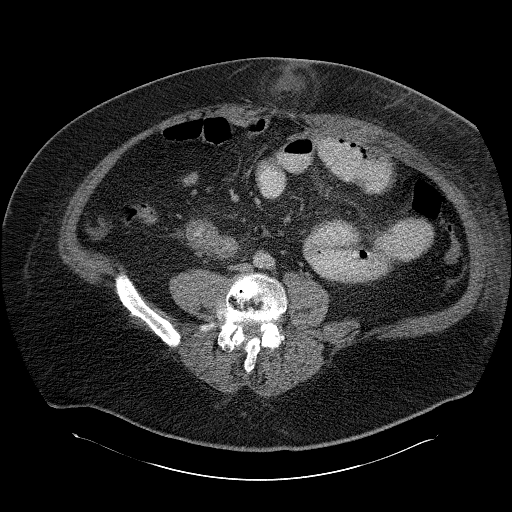
[im 7/62  bone]
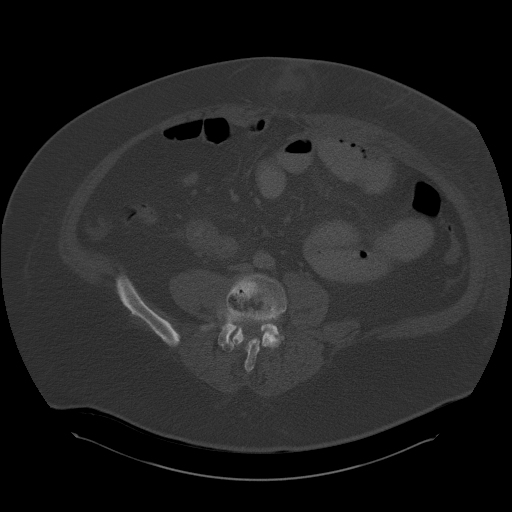
[im 13/62  soft-tissue]
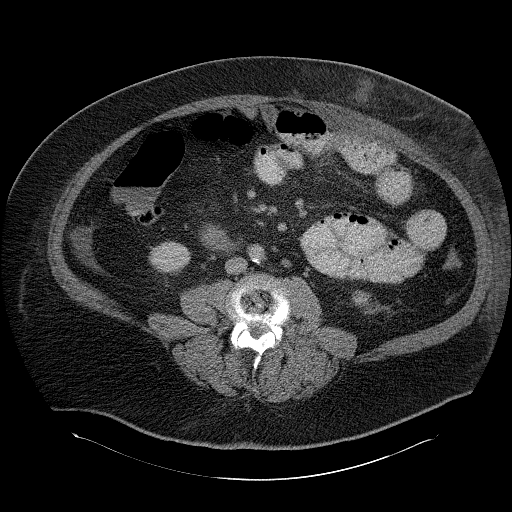
[im 19/62  soft-tissue]
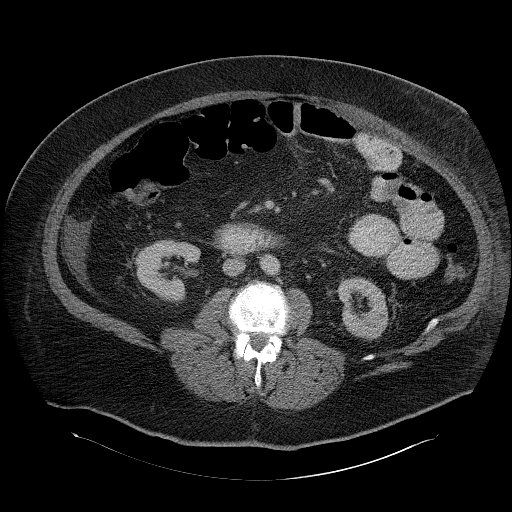
[im 25/62  soft-tissue]
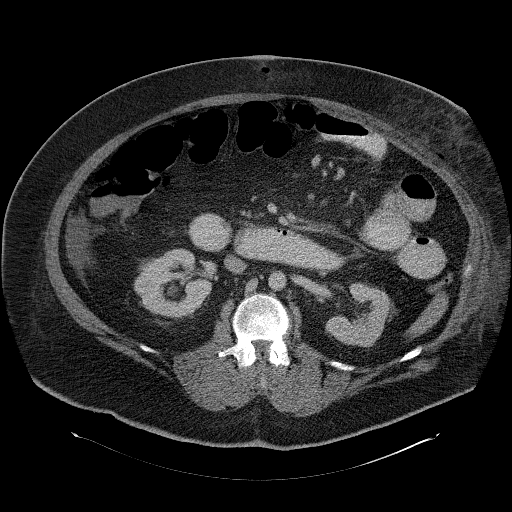
[im 31/62  soft-tissue]
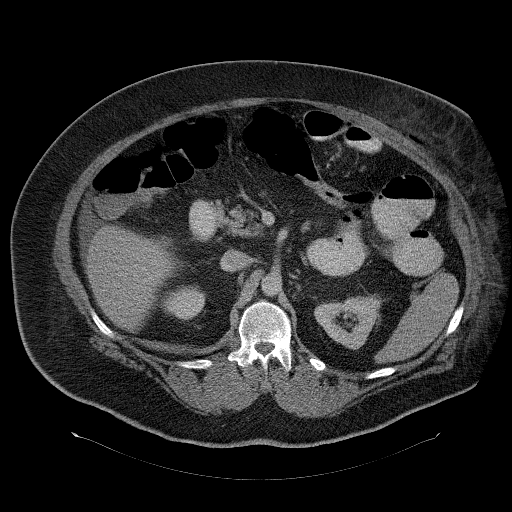
[im 37/62  soft-tissue]
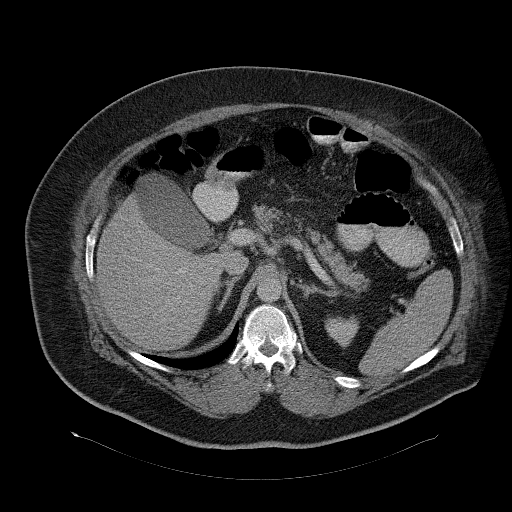
[im 43/62  soft-tissue]
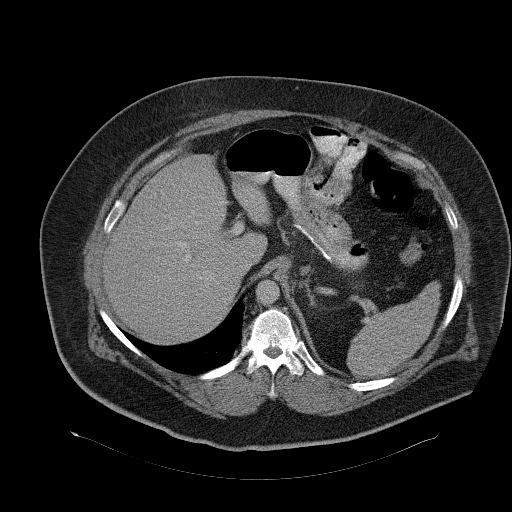
[im 49/62  soft-tissue]
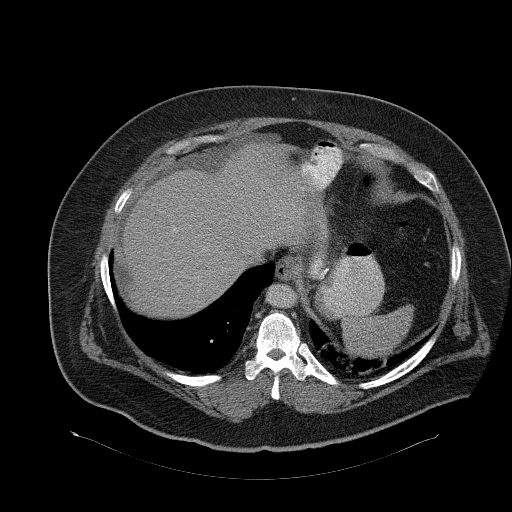
[im 55/62  soft-tissue]
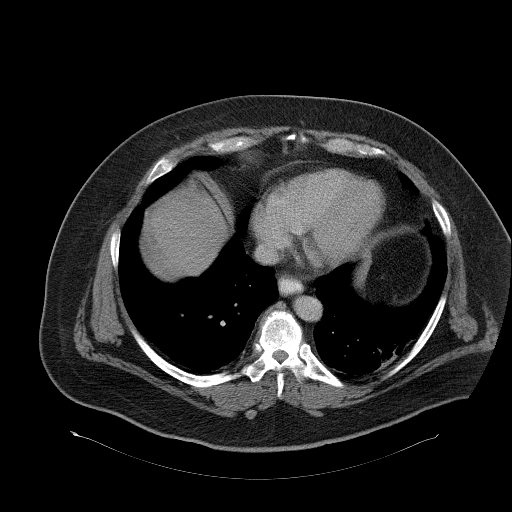
[im 55/62  bone]
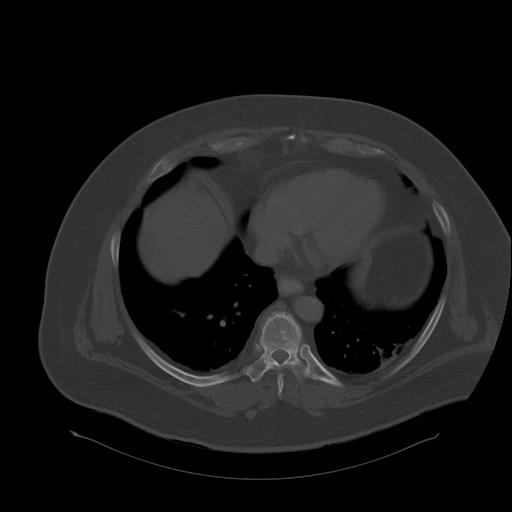

[Series 5: lung windows · axial · 0.98mm/px · z∈[-58,+2]mm · 4 of 80 slices shown]
[im 7/80  bone]
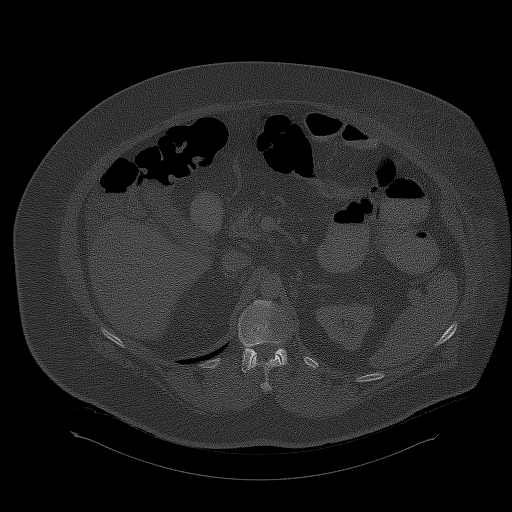
[im 19/80  bone]
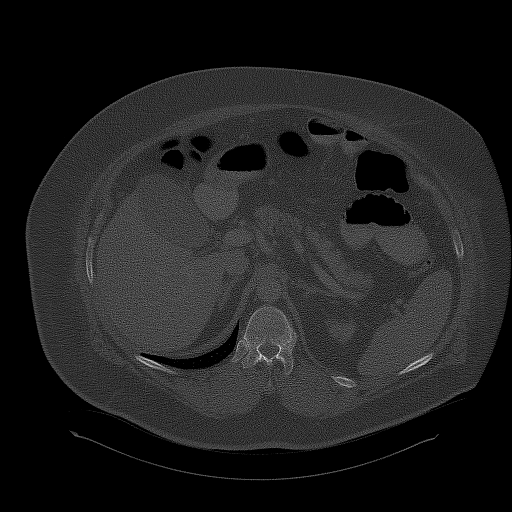
[im 25/80  bone]
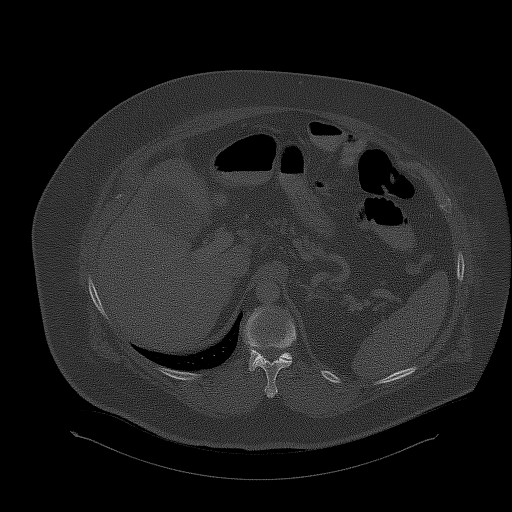
[im 37/80  bone]
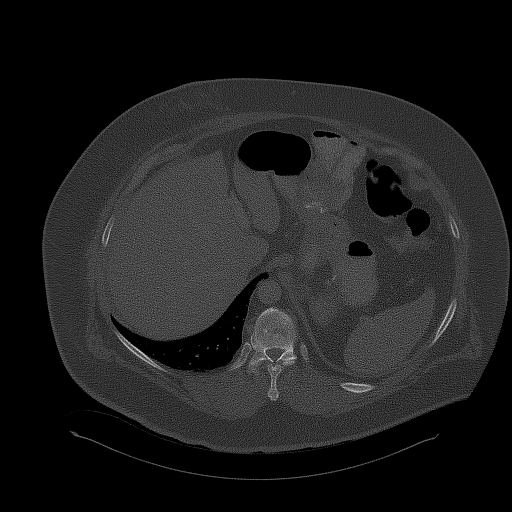

[Series 602: <mpr thick range> · coronal · 0.98mm/px · 3 of 182 slices shown]
[im 61/182  soft-tissue]
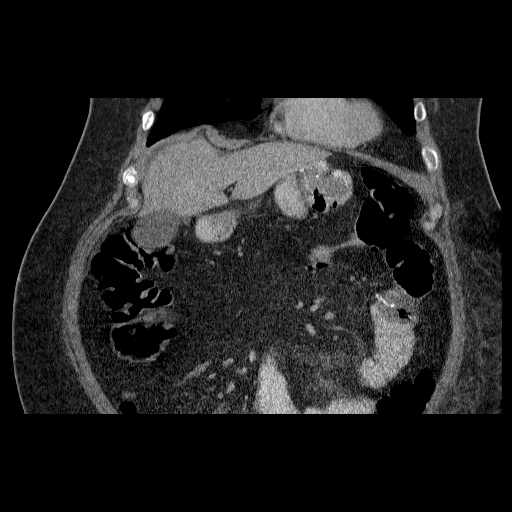
[im 81/182  soft-tissue]
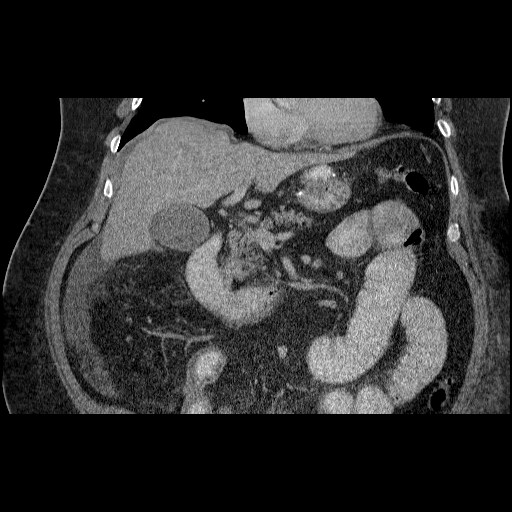
[im 101/182  soft-tissue]
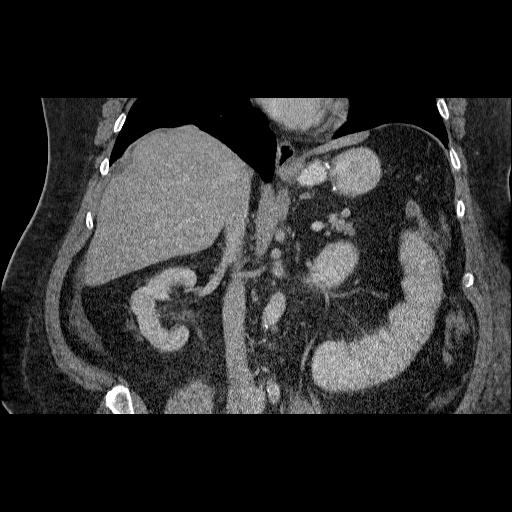

[16 of 46 positions shown; findings below may reference images not displayed]

FINDINGS: Lower chest: Lung bases show to subsegmental atelectasis in the left
lower lobe. Heart size normal. No pericardial effusion. Pre
pericardiac lymph nodes are sub cm in short axis size. Lower
esophagus is dilated and contains oral contrast, which can be seen
with dysmotility or reflux. No pleural effusion.

Hepatobiliary: Liver and gallbladder are unremarkable. No biliary
ductal dilatation.

Pancreas: Negative.

Spleen: Negative.

Adrenals/Urinary Tract: Adrenal glands and kidneys are unremarkable.

Stomach/Bowel: Gastric bypass. Small bowel is dilated to the level
of a periumbilical hernia. There is decompression of small bowel as
it exits the hernia but the visualized small bowel loops appear
somewhat thickened. Small bowel is not visualized in its entirety,
however. Visualized portion of the colon is unremarkable.

Vascular/Lymphatic: Atherosclerotic calcification of the arterial
vasculature without abdominal aortic aneurysm. No pathologically
enlarged lymph nodes. Scattered small bowel mesenteric nodularity
measures up to 11 mm, likely reactive.

Other: Small bowel mesenteric haziness is seen. Small ascites. Fluid
is seen within the periumbilical hernia, with a small locule of air,
possibly postoperative in etiology.

Musculoskeletal: No worrisome lytic or sclerotic lesions.
Degenerative changes are seen in the spine. Body wall edema along
the left anterolateral abdominal wall, with residual subcutaneous
emphysema.
IMPRESSION: 1. Small bowel obstruction secondary to an umbilical hernia. Mild
associated mesenteric edema and thickening of distal small bowel
loops. Impending ischemia cannot be excluded. These results were
called by telephone at the time of interpretation on 04/22/2016 at
[DATE] to Dr. NURSER MANDAL , who verbally acknowledged these
results.
2. Small ascites.

## 2018-06-10 DIAGNOSIS — G4733 Obstructive sleep apnea (adult) (pediatric): Secondary | ICD-10-CM | POA: Diagnosis not present

## 2018-08-03 DIAGNOSIS — L237 Allergic contact dermatitis due to plants, except food: Secondary | ICD-10-CM | POA: Diagnosis not present

## 2018-08-03 DIAGNOSIS — J029 Acute pharyngitis, unspecified: Secondary | ICD-10-CM | POA: Diagnosis not present

## 2018-10-03 DIAGNOSIS — Z125 Encounter for screening for malignant neoplasm of prostate: Secondary | ICD-10-CM | POA: Diagnosis not present

## 2018-10-03 DIAGNOSIS — M545 Low back pain: Secondary | ICD-10-CM | POA: Diagnosis not present

## 2018-10-03 DIAGNOSIS — B351 Tinea unguium: Secondary | ICD-10-CM | POA: Diagnosis not present

## 2018-10-03 DIAGNOSIS — E7800 Pure hypercholesterolemia, unspecified: Secondary | ICD-10-CM | POA: Diagnosis not present

## 2018-10-03 DIAGNOSIS — Z0000 Encounter for general adult medical examination without abnormal findings: Secondary | ICD-10-CM | POA: Diagnosis not present

## 2018-10-03 DIAGNOSIS — R945 Abnormal results of liver function studies: Secondary | ICD-10-CM | POA: Diagnosis not present

## 2018-10-03 DIAGNOSIS — Z23 Encounter for immunization: Secondary | ICD-10-CM | POA: Diagnosis not present

## 2023-05-13 DIAGNOSIS — Z6836 Body mass index (BMI) 36.0-36.9, adult: Secondary | ICD-10-CM | POA: Diagnosis not present

## 2023-05-13 DIAGNOSIS — M79673 Pain in unspecified foot: Secondary | ICD-10-CM | POA: Diagnosis not present

## 2023-05-13 DIAGNOSIS — Z9884 Bariatric surgery status: Secondary | ICD-10-CM | POA: Diagnosis not present

## 2023-05-13 DIAGNOSIS — E7800 Pure hypercholesterolemia, unspecified: Secondary | ICD-10-CM | POA: Diagnosis not present

## 2023-05-13 DIAGNOSIS — M545 Low back pain, unspecified: Secondary | ICD-10-CM | POA: Diagnosis not present

## 2023-05-13 DIAGNOSIS — G8929 Other chronic pain: Secondary | ICD-10-CM | POA: Diagnosis not present

## 2023-05-13 DIAGNOSIS — R945 Abnormal results of liver function studies: Secondary | ICD-10-CM | POA: Diagnosis not present

## 2023-05-13 DIAGNOSIS — M1611 Unilateral primary osteoarthritis, right hip: Secondary | ICD-10-CM | POA: Diagnosis not present

## 2023-05-20 DIAGNOSIS — M545 Low back pain, unspecified: Secondary | ICD-10-CM | POA: Diagnosis not present

## 2023-05-20 DIAGNOSIS — M25551 Pain in right hip: Secondary | ICD-10-CM | POA: Diagnosis not present

## 2023-05-20 DIAGNOSIS — Z96651 Presence of right artificial knee joint: Secondary | ICD-10-CM | POA: Diagnosis not present

## 2023-05-20 DIAGNOSIS — M1711 Unilateral primary osteoarthritis, right knee: Secondary | ICD-10-CM | POA: Diagnosis not present

## 2023-05-20 DIAGNOSIS — Z96652 Presence of left artificial knee joint: Secondary | ICD-10-CM | POA: Diagnosis not present

## 2023-05-20 DIAGNOSIS — Z09 Encounter for follow-up examination after completed treatment for conditions other than malignant neoplasm: Secondary | ICD-10-CM | POA: Diagnosis not present

## 2023-05-20 DIAGNOSIS — M1712 Unilateral primary osteoarthritis, left knee: Secondary | ICD-10-CM | POA: Diagnosis not present

## 2023-05-25 ENCOUNTER — Other Ambulatory Visit: Payer: Self-pay | Admitting: Orthopedic Surgery

## 2023-05-25 DIAGNOSIS — M545 Low back pain, unspecified: Secondary | ICD-10-CM

## 2023-06-02 ENCOUNTER — Ambulatory Visit
Admission: RE | Admit: 2023-06-02 | Discharge: 2023-06-02 | Disposition: A | Discharge status: Home / Self Care | Payer: PPO | Source: Ambulatory Visit | Attending: Orthopedic Surgery | Admitting: Orthopedic Surgery

## 2023-06-02 DIAGNOSIS — M5126 Other intervertebral disc displacement, lumbar region: Secondary | ICD-10-CM | POA: Diagnosis not present

## 2023-06-02 DIAGNOSIS — M4802 Spinal stenosis, cervical region: Secondary | ICD-10-CM | POA: Diagnosis not present

## 2023-06-02 DIAGNOSIS — M545 Low back pain, unspecified: Secondary | ICD-10-CM

## 2023-06-02 DIAGNOSIS — M47816 Spondylosis without myelopathy or radiculopathy, lumbar region: Secondary | ICD-10-CM | POA: Diagnosis not present

## 2023-06-26 ENCOUNTER — Other Ambulatory Visit: Payer: BLUE CROSS/BLUE SHIELD

## 2023-06-29 DIAGNOSIS — M5441 Lumbago with sciatica, right side: Secondary | ICD-10-CM | POA: Diagnosis not present

## 2023-07-05 DIAGNOSIS — M79671 Pain in right foot: Secondary | ICD-10-CM | POA: Diagnosis not present

## 2023-07-05 DIAGNOSIS — M79672 Pain in left foot: Secondary | ICD-10-CM | POA: Diagnosis not present

## 2023-07-05 DIAGNOSIS — M5416 Radiculopathy, lumbar region: Secondary | ICD-10-CM | POA: Diagnosis not present

## 2023-07-05 DIAGNOSIS — Z6836 Body mass index (BMI) 36.0-36.9, adult: Secondary | ICD-10-CM | POA: Diagnosis not present

## 2023-07-09 DIAGNOSIS — M5416 Radiculopathy, lumbar region: Secondary | ICD-10-CM | POA: Diagnosis not present

## 2023-07-19 ENCOUNTER — Other Ambulatory Visit: Payer: Self-pay | Admitting: Family Medicine

## 2023-07-19 DIAGNOSIS — Z131 Encounter for screening for diabetes mellitus: Secondary | ICD-10-CM | POA: Diagnosis not present

## 2023-07-19 DIAGNOSIS — Z6836 Body mass index (BMI) 36.0-36.9, adult: Secondary | ICD-10-CM | POA: Diagnosis not present

## 2023-07-19 DIAGNOSIS — E7800 Pure hypercholesterolemia, unspecified: Secondary | ICD-10-CM

## 2023-07-19 DIAGNOSIS — R635 Abnormal weight gain: Secondary | ICD-10-CM | POA: Diagnosis not present

## 2023-07-19 DIAGNOSIS — E559 Vitamin D deficiency, unspecified: Secondary | ICD-10-CM | POA: Diagnosis not present

## 2023-07-19 DIAGNOSIS — I10 Essential (primary) hypertension: Secondary | ICD-10-CM | POA: Diagnosis not present

## 2023-07-19 DIAGNOSIS — Z1331 Encounter for screening for depression: Secondary | ICD-10-CM | POA: Diagnosis not present

## 2023-07-19 DIAGNOSIS — Z9884 Bariatric surgery status: Secondary | ICD-10-CM | POA: Diagnosis not present

## 2023-07-19 DIAGNOSIS — K912 Postsurgical malabsorption, not elsewhere classified: Secondary | ICD-10-CM | POA: Diagnosis not present

## 2023-07-19 DIAGNOSIS — G4733 Obstructive sleep apnea (adult) (pediatric): Secondary | ICD-10-CM | POA: Diagnosis not present

## 2023-07-20 ENCOUNTER — Encounter: Payer: Self-pay | Admitting: Podiatry

## 2023-07-20 ENCOUNTER — Ambulatory Visit (INDEPENDENT_AMBULATORY_CARE_PROVIDER_SITE_OTHER): Payer: PPO

## 2023-07-20 ENCOUNTER — Ambulatory Visit (INDEPENDENT_AMBULATORY_CARE_PROVIDER_SITE_OTHER): Payer: PPO | Admitting: Podiatry

## 2023-07-20 DIAGNOSIS — M7752 Other enthesopathy of left foot: Secondary | ICD-10-CM | POA: Diagnosis not present

## 2023-07-20 DIAGNOSIS — M898X9 Other specified disorders of bone, unspecified site: Secondary | ICD-10-CM | POA: Diagnosis not present

## 2023-07-20 DIAGNOSIS — M7751 Other enthesopathy of right foot: Secondary | ICD-10-CM

## 2023-07-20 DIAGNOSIS — M19072 Primary osteoarthritis, left ankle and foot: Secondary | ICD-10-CM | POA: Diagnosis not present

## 2023-07-20 NOTE — Progress Notes (Signed)
Subjective:  Patient ID: Derrick [name], male    DOB: 03-May-1956,  MRN: [id number]  Chief Complaint  Patient presents with   Foot Pain    "I've developed a lump on the sides of my feet.  I'd like him to check the Gout in my big toes but the lumps are my main concern." N - lump on feet L - lateral bilateral D - 6 mos O - suddenly, about the same C - throbbing A - walking with newer hiking boots T - wear my old boots, Diclofenac cream    67 y.o. male presents with the above complaint. History confirmed with patient.   Objective:  Physical Exam: warm, good capillary refill, no trophic changes or ulcerative lesions, normal DP and PT pulses, normal sensory exam, and bilateral pain at the fifth metatarsal base the insertion of the peroneus brevis especially plantar there is a small fluctuant area bilaterally.  Has palpable dorsal and medial spurring on the left first MTPJ, nontender, limited range of motion of the big toe.  Palpable dorsal spurring bilateral midfoot..   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 Bony exostosis   2. Bursitis of both feet   3. Osteoarthritis of first metatarsophalangeal (MTP) joint of left foot      Plan:  Patient was evaluated and treated and all questions answered.  We reviewed his radiographs we discussed the prominent fifth metatarsal base and likely bursitis under the fifth metatarsal base.  Discussed treatment of this including anti-inflammatories rest ice and appropriate shoe gear.  This developed last year after getting a new pair of hiking boots.  Discussed appropriate shoe with and trying different styles and wets that are more comfortable.  Long-term discussed a custom molded orthosis with  a offload as well if it is not improving.  I recommended corticosteroid injection today.  Following sterile prep with alcohol the fifth metatarsal base was injected bilaterally with 4 mg of dexamethasone and 0.5 cc of 0.5% Marcaine plain.  He tolerated as well was dressed with a Band-Aid.  We also reviewed the midfoot and forefoot arthritis present on his x-rays relatively asymptomatic so far.  Discussed corticosteroid or surgical correction if this becomes symptomatic for him in the long run.  Return if symptoms worsen or fail to improve.

## 2023-07-21 DIAGNOSIS — M5416 Radiculopathy, lumbar region: Secondary | ICD-10-CM | POA: Diagnosis not present

## 2023-08-04 ENCOUNTER — Ambulatory Visit
Admission: RE | Admit: 2023-08-04 | Discharge: 2023-08-04 | Disposition: A | Discharge status: Home / Self Care | Payer: No Typology Code available for payment source | Source: Ambulatory Visit | Attending: Family Medicine | Admitting: Family Medicine

## 2023-08-04 DIAGNOSIS — E7800 Pure hypercholesterolemia, unspecified: Secondary | ICD-10-CM

## 2023-08-04 DIAGNOSIS — Z136 Encounter for screening for cardiovascular disorders: Secondary | ICD-10-CM | POA: Diagnosis not present

## 2023-08-04 DIAGNOSIS — I10 Essential (primary) hypertension: Secondary | ICD-10-CM

## 2023-08-04 DIAGNOSIS — I700 Atherosclerosis of aorta: Secondary | ICD-10-CM | POA: Diagnosis not present

## 2023-08-10 ENCOUNTER — Other Ambulatory Visit: Payer: No Typology Code available for payment source

## 2023-08-10 DIAGNOSIS — E559 Vitamin D deficiency, unspecified: Secondary | ICD-10-CM | POA: Diagnosis not present

## 2023-08-10 DIAGNOSIS — K912 Postsurgical malabsorption, not elsewhere classified: Secondary | ICD-10-CM | POA: Diagnosis not present

## 2023-08-10 DIAGNOSIS — Z9884 Bariatric surgery status: Secondary | ICD-10-CM | POA: Diagnosis not present

## 2023-08-10 DIAGNOSIS — I10 Essential (primary) hypertension: Secondary | ICD-10-CM | POA: Diagnosis not present

## 2023-08-10 DIAGNOSIS — E7800 Pure hypercholesterolemia, unspecified: Secondary | ICD-10-CM | POA: Diagnosis not present

## 2023-08-10 DIAGNOSIS — I700 Atherosclerosis of aorta: Secondary | ICD-10-CM | POA: Diagnosis not present

## 2023-08-10 DIAGNOSIS — Z6835 Body mass index (BMI) 35.0-35.9, adult: Secondary | ICD-10-CM | POA: Diagnosis not present

## 2023-08-19 DIAGNOSIS — M47816 Spondylosis without myelopathy or radiculopathy, lumbar region: Secondary | ICD-10-CM | POA: Diagnosis not present

## 2023-08-19 DIAGNOSIS — M961 Postlaminectomy syndrome, not elsewhere classified: Secondary | ICD-10-CM | POA: Diagnosis not present

## 2023-08-24 DIAGNOSIS — M5416 Radiculopathy, lumbar region: Secondary | ICD-10-CM | POA: Diagnosis not present

## 2023-08-24 DIAGNOSIS — I10 Essential (primary) hypertension: Secondary | ICD-10-CM | POA: Diagnosis not present

## 2023-08-24 DIAGNOSIS — Z9884 Bariatric surgery status: Secondary | ICD-10-CM | POA: Diagnosis not present

## 2023-08-24 DIAGNOSIS — K912 Postsurgical malabsorption, not elsewhere classified: Secondary | ICD-10-CM | POA: Diagnosis not present

## 2023-08-24 DIAGNOSIS — Z6835 Body mass index (BMI) 35.0-35.9, adult: Secondary | ICD-10-CM | POA: Diagnosis not present

## 2023-08-24 DIAGNOSIS — I700 Atherosclerosis of aorta: Secondary | ICD-10-CM | POA: Diagnosis not present

## 2023-08-24 DIAGNOSIS — E7800 Pure hypercholesterolemia, unspecified: Secondary | ICD-10-CM | POA: Diagnosis not present

## 2023-08-24 DIAGNOSIS — E559 Vitamin D deficiency, unspecified: Secondary | ICD-10-CM | POA: Diagnosis not present

## 2023-09-01 DIAGNOSIS — M5416 Radiculopathy, lumbar region: Secondary | ICD-10-CM | POA: Diagnosis not present

## 2023-10-05 DIAGNOSIS — M47816 Spondylosis without myelopathy or radiculopathy, lumbar region: Secondary | ICD-10-CM | POA: Diagnosis not present

## 2023-10-05 DIAGNOSIS — M961 Postlaminectomy syndrome, not elsewhere classified: Secondary | ICD-10-CM | POA: Diagnosis not present

## 2023-10-07 DIAGNOSIS — M47816 Spondylosis without myelopathy or radiculopathy, lumbar region: Secondary | ICD-10-CM | POA: Diagnosis not present

## 2023-10-07 DIAGNOSIS — M961 Postlaminectomy syndrome, not elsewhere classified: Secondary | ICD-10-CM | POA: Diagnosis not present

## 2023-10-11 NOTE — Progress Notes (Unsigned)
Cardiology Office Note:   Date:  10/12/2023  ID:  Derrick [name], DOB Feb 21, 1956, MRN [id number] PCP:  Shireen Quan, DO  [facility] Providers Cardiologist:  Alverda Skeans, MD Referring MD: Salley Scarlet, MD  Chief Complaint/Reason for Referral: Elevated coronary artery calcification ASSESSMENT:    1. Coronary artery calcification seen on CAT scan   2. Aortic atherosclerosis (HCC)   3. Hyperlipidemia LDL goal <70   4. Primary hypertension   5. CKD (chronic kidney disease) stage 2, GFR 60-89 ml/min   6. BMI 32.0-32.9,adult     PLAN:   In order of problems listed above: Coronary artery calcification: Start ASA 81mg , continue Zetia, and strict blood pressure control. Aortic atherosclerosis: See #1 above. Hyperlipidemia: Will check lipid panel, LP(a) in 2 months.  If not at goal, will refer to pharmacy for recommendations. Hypertension:  BP is well controlled today CKD stage II:  Monitor for now, if need a medication for BP, would start ARB or ACEi. Elevated BMI: Hemoglobin A1c was 5.1.  Continue diet and exercise modification.           Dispo:  Return in about 6 months (around 04/11/2024).      Medication Adjustments/Labs and Tests Ordered: Current medicines are reviewed at length with the patient today.  Concerns regarding medicines are outlined above.  The following changes have been made:     Labs/tests ordered: Orders Placed This Encounter  Procedures   Lipid panel   Lipoprotein A (LPA)   EKG 12-Lead    Medication Changes: Meds ordered this encounter  Medications   aspirin EC 81 MG tablet    Sig: Take 1 tablet (81 mg total) by mouth daily. Swallow whole.    Current medicines are reviewed at length with the patient today.  The patient does not have concerns regarding medicines.  I spent 34 minutes reviewing all clinical data during and prior to this visit including all relevant imaging studies, laboratories, clinical information from other health systems,  and prior notes from both Cardiology and other specialties, interviewing the patient, and conducting a complete physical examination in order to formulate a comprehensive and personalized evaluation and treatment plan.  History of Present Illness:      FOCUSED PROBLEM LIST:   Coronary artery calcification  Calcium score CT 2024  Hyperlipidemia Myalgias in response to simvastatin and atorvastatin Aortic atherosclerosis Calcium score CT 2024 Hypertension CKD stage II BMI 32  October 2024: The patient is 67 year old with the above listed medical problems referred by his PCP for recommendations regarding his elevated coronary calcium score and hyperlipidemia.  The patient had a lipid panel drawn in January of this year which demonstrated an LDL of 93.  The patient is experienced remarkable weight loss over the last 8 years or so.  He was up to about 350 pounds and through an intensive regimen of hiking he lost about 100 pounds.  He has gained some of that back but continues to hike on a regular basis.  He denies any exertional angina or exertional dyspnea.  He is currently off aspirin, lisinopril, or Zocor.  He had issues with several different statins in the past.  He has had no issues with Zetia.  He is required no emergency room visits or hospitalizations recently for any cardiovascular issues.         Current Medications: Current Meds  Medication Sig   acetaminophen (TYLENOL) 500 MG tablet Take 500 mg by mouth 3 (three) times daily.   aspirin  EC 81 MG tablet Take 1 tablet (81 mg total) by mouth daily. Swallow whole.   cyclobenzaprine (FLEXERIL) 10 MG tablet Take 1 tablet (10 mg total) by mouth 2 (two) times daily. (Patient taking differently: Take 10 mg by mouth 3 (three) times daily.)   diclofenac Sodium (VOLTAREN) 1 % GEL Apply topically 4 (four) times daily.   diphenhydrAMINE (BENADRYL) 25 MG tablet Take 50 mg by mouth every 6 (six) hours as needed for itching.   ezetimibe (ZETIA) 10  MG tablet Take 10 mg by mouth daily.   loratadine (CLARITIN) 10 MG tablet Take 10 mg by mouth daily as needed for allergies.   Multiple Vitamin (MULTIVITAMIN) tablet Take 1 tablet by mouth daily.   naproxen sodium (ANAPROX) 220 MG tablet Take 220 mg by mouth 2 (two) times daily as needed (pain).   topiramate (TOPAMAX) 25 MG tablet Take 25 mg by mouth daily.   traMADol (ULTRAM) 50 MG tablet Take one tablet by mouth twice daily for pain (Patient taking differently: Take 50 mg by mouth 3 (three) times daily.)     Review of Systems:   Please see the history of present illness.    All other systems reviewed and are negative.     EKGs/Labs/Other Test Reviewed:   EKG: EKG done 2017 demonstrates sinus rhythm  EKG Interpretation Date/Time:  Tuesday October 12 2023 14:52:50 EDT Ventricular Rate:  100 PR Interval:  172 QRS Duration:  80 QT Interval:  336 QTC Calculation: 433 R Axis:   28  Text Interpretation: Normal sinus rhythm Normal ECG Occasional PACs When compared with ECG of 23-Jan-2016 11:52, Premature atrial complexes now seen Confirmed by Alverda Skeans ([id number]) on 10/12/2023 3:01:41 PM         Risk Assessment/Calculations:          Physical Exam:   VS:  BP 125/75   Pulse 81   Ht 5' 11.5" (1.816 m)   Wt 258 lb 6.4 oz (117.2 kg)   SpO2 94%   BMI 35.54 kg/m        Wt Readings from Last 3 Encounters:  10/12/23 258 lb 6.4 oz (117.2 kg)  11/23/16 230 lb 6.4 oz (104.5 kg)  06/19/16 284 lb 3.2 oz (128.9 kg)      GENERAL:  No apparent distress, AOx3 HEENT:  No carotid bruits, +2 carotid impulses, no scleral icterus CAR: RRR no murmurs, gallops, rubs, or thrills RES:  Clear to auscultation bilaterally ABD:  Soft, nontender, nondistended, positive bowel sounds x 4 VASC:  +2 radial pulses, +2 carotid pulses NEURO:  CN 2-12 grossly intact; motor and sensory grossly intact PSYCH:  No active depression or anxiety EXT:  No edema, ecchymosis, or cyanosis  Signed, Orbie Pyo, MD  10/12/2023 3:36 PM    [facility] [address] [place], [place], [place]  [address] Phone: [phone]; Fax: [phone]   Note:  This document was prepared using Dragon voice recognition software and may include unintentional dictation errors.

## 2023-10-12 ENCOUNTER — Encounter: Payer: Self-pay | Admitting: Internal Medicine

## 2023-10-12 ENCOUNTER — Ambulatory Visit: Payer: PPO | Attending: Internal Medicine | Admitting: Internal Medicine

## 2023-10-12 VITALS — BP 125/75 | HR 81 | Ht 71.5 in | Wt 258.4 lb

## 2023-10-12 DIAGNOSIS — Z6832 Body mass index (BMI) 32.0-32.9, adult: Secondary | ICD-10-CM | POA: Diagnosis not present

## 2023-10-12 DIAGNOSIS — I10 Essential (primary) hypertension: Secondary | ICD-10-CM | POA: Diagnosis not present

## 2023-10-12 DIAGNOSIS — N182 Chronic kidney disease, stage 2 (mild): Secondary | ICD-10-CM

## 2023-10-12 DIAGNOSIS — E785 Hyperlipidemia, unspecified: Secondary | ICD-10-CM | POA: Diagnosis not present

## 2023-10-12 DIAGNOSIS — I700 Atherosclerosis of aorta: Secondary | ICD-10-CM

## 2023-10-12 DIAGNOSIS — I2510 Atherosclerotic heart disease of native coronary artery without angina pectoris: Secondary | ICD-10-CM

## 2023-10-12 DIAGNOSIS — M47816 Spondylosis without myelopathy or radiculopathy, lumbar region: Secondary | ICD-10-CM | POA: Diagnosis not present

## 2023-10-12 DIAGNOSIS — M961 Postlaminectomy syndrome, not elsewhere classified: Secondary | ICD-10-CM | POA: Diagnosis not present

## 2023-10-12 MED ORDER — ASPIRIN 81 MG PO TBEC: 81.0000 mg | DELAYED_RELEASE_TABLET | Freq: Every day | ORAL | Status: AC

## 2023-10-12 NOTE — Patient Instructions (Signed)
Medication Instructions:  Your physician has recommended you make the following change in your medication:   1) START aspirin 81 mg daily (can purchase over the counter)  *If you need a refill on your cardiac medications before your next appointment, please call your pharmacy*  Lab Work: In 2 months at any [facility] office: fasting Lipid panel and lipoprotein A.  If you have labs (blood work) drawn today and your tests are completely normal, you will receive your results only by: MyChart Message (if you have MyChart) OR A paper copy in the mail If you have any lab test that is abnormal or we need to change your treatment, we will call you to review the results.  Testing/Procedures: None ordered today.  Follow-Up: At [facility], you and your health needs are our priority.  As part of our continuing mission to provide you with exceptional heart care, we have created designated Provider Care Teams.  These Care Teams include your primary Cardiologist (physician) and Advanced Practice Providers (APPs -  Physician Assistants and Nurse Practitioners) who all work together to provide you with the care you need, when you need it.  Your next appointment:   6 month(s)  The format for your next appointment:   In Person  Provider:   Jari Favre, PA-C, Ronie Spies, PA-C, Robin Searing, NP, Jacolyn Reedy, PA-C, Eligha Bridegroom, NP, Tereso Newcomer, PA-C, or Perlie Gold, PA-C

## 2023-10-14 DIAGNOSIS — M961 Postlaminectomy syndrome, not elsewhere classified: Secondary | ICD-10-CM | POA: Diagnosis not present

## 2023-10-14 DIAGNOSIS — M47816 Spondylosis without myelopathy or radiculopathy, lumbar region: Secondary | ICD-10-CM | POA: Diagnosis not present

## 2023-10-18 DIAGNOSIS — M47816 Spondylosis without myelopathy or radiculopathy, lumbar region: Secondary | ICD-10-CM | POA: Diagnosis not present

## 2023-10-18 DIAGNOSIS — M961 Postlaminectomy syndrome, not elsewhere classified: Secondary | ICD-10-CM | POA: Diagnosis not present

## 2023-10-25 DIAGNOSIS — M961 Postlaminectomy syndrome, not elsewhere classified: Secondary | ICD-10-CM | POA: Diagnosis not present

## 2023-10-25 DIAGNOSIS — M47816 Spondylosis without myelopathy or radiculopathy, lumbar region: Secondary | ICD-10-CM | POA: Diagnosis not present

## 2023-10-28 DIAGNOSIS — M961 Postlaminectomy syndrome, not elsewhere classified: Secondary | ICD-10-CM | POA: Diagnosis not present

## 2023-10-28 DIAGNOSIS — M47816 Spondylosis without myelopathy or radiculopathy, lumbar region: Secondary | ICD-10-CM | POA: Diagnosis not present

## 2023-11-08 ENCOUNTER — Ambulatory Visit
Admission: RE | Admit: 2023-11-08 | Discharge: 2023-11-08 | Disposition: A | Discharge status: Home / Self Care | Payer: PPO | Source: Ambulatory Visit | Attending: Family Medicine | Admitting: Family Medicine

## 2023-11-08 ENCOUNTER — Other Ambulatory Visit: Payer: Self-pay | Admitting: Family Medicine

## 2023-11-08 DIAGNOSIS — M25511 Pain in right shoulder: Secondary | ICD-10-CM | POA: Diagnosis not present

## 2023-11-08 DIAGNOSIS — Z6836 Body mass index (BMI) 36.0-36.9, adult: Secondary | ICD-10-CM | POA: Diagnosis not present

## 2023-11-08 DIAGNOSIS — M47812 Spondylosis without myelopathy or radiculopathy, cervical region: Secondary | ICD-10-CM | POA: Diagnosis not present

## 2023-11-08 DIAGNOSIS — M5412 Radiculopathy, cervical region: Secondary | ICD-10-CM | POA: Diagnosis not present

## 2023-11-10 DIAGNOSIS — E7800 Pure hypercholesterolemia, unspecified: Secondary | ICD-10-CM | POA: Diagnosis not present

## 2023-11-10 DIAGNOSIS — Z131 Encounter for screening for diabetes mellitus: Secondary | ICD-10-CM | POA: Diagnosis not present

## 2023-11-10 DIAGNOSIS — M170 Bilateral primary osteoarthritis of knee: Secondary | ICD-10-CM | POA: Diagnosis not present

## 2023-11-10 DIAGNOSIS — Z0000 Encounter for general adult medical examination without abnormal findings: Secondary | ICD-10-CM | POA: Diagnosis not present

## 2023-11-10 DIAGNOSIS — Z125 Encounter for screening for malignant neoplasm of prostate: Secondary | ICD-10-CM | POA: Diagnosis not present

## 2023-11-10 DIAGNOSIS — Z23 Encounter for immunization: Secondary | ICD-10-CM | POA: Diagnosis not present

## 2023-11-10 DIAGNOSIS — R030 Elevated blood-pressure reading, without diagnosis of hypertension: Secondary | ICD-10-CM | POA: Diagnosis not present

## 2023-11-10 DIAGNOSIS — Z6836 Body mass index (BMI) 36.0-36.9, adult: Secondary | ICD-10-CM | POA: Diagnosis not present

## 2023-11-11 DIAGNOSIS — M47816 Spondylosis without myelopathy or radiculopathy, lumbar region: Secondary | ICD-10-CM | POA: Diagnosis not present

## 2023-11-11 DIAGNOSIS — M5412 Radiculopathy, cervical region: Secondary | ICD-10-CM | POA: Diagnosis not present

## 2023-11-11 DIAGNOSIS — M961 Postlaminectomy syndrome, not elsewhere classified: Secondary | ICD-10-CM | POA: Diagnosis not present

## 2023-12-03 ENCOUNTER — Ambulatory Visit (HOSPITAL_BASED_OUTPATIENT_CLINIC_OR_DEPARTMENT_OTHER): Payer: PPO | Admitting: Cardiology

## 2024-01-06 DIAGNOSIS — E785 Hyperlipidemia, unspecified: Secondary | ICD-10-CM | POA: Diagnosis not present

## 2024-01-07 ENCOUNTER — Encounter: Payer: Self-pay | Admitting: Internal Medicine

## 2024-01-07 ENCOUNTER — Other Ambulatory Visit: Payer: Self-pay | Admitting: *Deleted

## 2024-01-07 DIAGNOSIS — Z789 Other specified health status: Secondary | ICD-10-CM

## 2024-01-07 LAB — LIPID PANEL
Chol/HDL Ratio: 2.9 {ratio} (ref 0.0–5.0)
Cholesterol, Total: 208 mg/dL — ABNORMAL HIGH (ref 100–199)
HDL: 72 mg/dL (ref >39)
LDL Chol Calc (NIH): 119 mg/dL — ABNORMAL HIGH (ref 0–99)
Triglycerides: 97 mg/dL (ref 0–149)
VLDL Cholesterol Cal: 17 mg/dL (ref 5–40)

## 2024-01-07 LAB — LIPOPROTEIN A (LPA): Lipoprotein (a): 45 nmol/L (ref <75.0)

## 2024-01-07 NOTE — Progress Notes (Signed)
pharmD referral for lipid management per Dr. Lynnette Caffey.

## 2024-02-10 DIAGNOSIS — Z79891 Long term (current) use of opiate analgesic: Secondary | ICD-10-CM | POA: Diagnosis not present

## 2024-02-23 ENCOUNTER — Ambulatory Visit: Payer: PPO | Attending: Cardiology | Admitting: Pharmacist

## 2024-02-23 DIAGNOSIS — E785 Hyperlipidemia, unspecified: Secondary | ICD-10-CM

## 2024-02-23 MED ORDER — ROSUVASTATIN CALCIUM 5 MG PO TABS: 5.0000 mg | ORAL_TABLET | Freq: Every day | ORAL | 3 refills | Status: DC

## 2024-02-23 NOTE — Assessment & Plan Note (Signed)
 Assessment: LDL-C above goal of less than 70 on ezetimibe 10 mg daily Intolerant to simvastatin and atorvastatin pravastatin Patient is active and hikes about 20 miles per week We talked about medication options including rosuvastatin 5mg  a day versus 5 mg 3 times a week, Repatha and Nexlizet Patient has a fear of needles and does not want to do injections Concerned about cost of brand-name medicines Patient willing to start with rosuvastatin 5 mg daily and decrease if needed  Plan: Start rosuvastatin 5 mg daily Repeat labs in 3 months lipid panel and LFTs Patient to call if he has issues tolerating

## 2024-02-23 NOTE — Progress Notes (Signed)
 Patient ID: Derrick [name]                 DOB: 09/10/1956                    MRN: [id number]      HPI: Derrick [name] is a 68 y.o. male patient referred to lipid clinic by Dr. Lynnette Caffey. PMH is significant for HTN, CKD, aortic atherosclerosis, HLD, CAC 117 (57th percentile). LDL-C in Dec was 119.  Patient presents today to lipid clinic.  He reports intolerance to simvastatin and atorvastatin pravastatin.  States that after a few weeks of taking the medication when he hikes his joints especially his knees and hips feel that they are going to fall off.  States is very painful.  Tolerating ezetimibe fine.  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  Has lost a significant amount of weight, has gained some of that back.  Hikes several miles 2-3 times per week.  Averages about 20 miles a week on a good week.  He is retired and on [employer].  Concerned about cost of medications.   Current Medications: ezetimibe 10mg  daily Intolerances: simvastatin 20mg , atorvastatin, pravastatin (joint pain) Risk Factors: Coronary calcium score 117, hypertension LDL-C goal: <70 ApoB goal: <80  Diet: greek yogurt- fat free  Exercise: hikes 2-3 times per week at 4-5hr at a time  Family History: No family history on file.   Social History:  Social History   Socioeconomic History   Marital status: Married    Spouse name: Not on file   Number of children: Not on file   Years of education: Not on file   Highest education level: Not on file  Occupational History   Not on file  Tobacco Use   Smoking status: Never   Smokeless tobacco: Never  Substance and Sexual Activity   Alcohol use: No   Drug use: No   Sexual activity: Not on file  Other Topics Concern   Not on file  Social History Narrative   Not on file   Social Drivers of Health   Financial Resource Strain: Low Risk  (08/16/2023)   Received from [facility]   Overall Financial Resource Strain (CARDIA)    Difficulty of Paying Living Expenses: Not  hard at all  Food Insecurity: No Food Insecurity (08/16/2023)   Received from [facility]   Hunger Vital Sign    Worried About Running Out of Food in the Last Year: Never true    Ran Out of Food in the Last Year: Never true  Transportation Needs: No Transportation Needs (08/16/2023)   Received from [facility] - Transportation    Lack of Transportation (Medical): No    Lack of Transportation (Non-Medical): No  Physical Activity: Sufficiently Active (08/16/2023)   [facility]   Exercise Vital Sign    Days of Exercise per Week: 3 days    Minutes of Exercise per Session: 150+ min  Stress: No Stress Concern Present (08/16/2023)   Received from [facility] - Occupational Stress Questionnaire    Feeling of Stress : Not at all  Social Connections: Socially Integrated (08/16/2023)   Received from [facility]   Social Network    How would you rate your social network (family, work, friends)?: Good participation with social networks  Intimate Partner Violence: Not At Risk (08/16/2023)   Received from [facility]   HITS    Over the  last 12 months how often did your partner physically hurt you?: Never    Over the last 12 months how often did your partner insult you or talk down to you?: Never    Over the last 12 months how often did your partner threaten you with physical harm?: Never    Over the last 12 months how often did your partner scream or curse at you?: Never     Labs: Lipid Panel     Component Value Date/Time   CHOL 208 (H) 01/06/2024 0837   TRIG 97 01/06/2024 0837   HDL 72 01/06/2024 0837   CHOLHDL 2.9 01/06/2024 0837   LDLCALC 119 (H) 01/06/2024 0837   LABVLDL 17 01/06/2024 0837    Past Medical History:  Diagnosis Date   Arthritis    Hypertension    Obese    Sleep apnea    cpap       last study 99   Spinal stenosis    with back spasms, lumbar   Umbilical hernia    small, golf ball sized     Current Outpatient Medications on File Prior to Visit  Medication Sig Dispense Refill   acetaminophen (TYLENOL) 500 MG tablet Take 500 mg by mouth 3 (three) times daily.     aspirin EC 81 MG tablet Take 1 tablet (81 mg total) by mouth daily. Swallow whole.     cyclobenzaprine (FLEXERIL) 10 MG tablet Take 1 tablet (10 mg total) by mouth 2 (two) times daily. (Patient taking differently: Take 10 mg by mouth 3 (three) times daily.) 60 tablet 0   ezetimibe (ZETIA) 10 MG tablet Take 10 mg by mouth daily.     gabapentin (NEURONTIN) 300 MG capsule Take 300 mg by mouth 3 (three) times daily.     loratadine (CLARITIN) 10 MG tablet Take 10 mg by mouth daily as needed for allergies.     Multiple Vitamin (MULTIVITAMIN) tablet Take 1 tablet by mouth daily.     traMADol (ULTRAM) 50 MG tablet Take one tablet by mouth twice daily for pain (Patient taking differently: Take 50 mg by mouth 3 (three) times daily.) 60 tablet 5   No current facility-administered medications on file prior to visit.    Allergies  Allergen Reactions   Hazel Tree Pollen [Corylus]     Hazel nuts, and pollen    Assessment/Plan:  1. Hyperlipidemia -  Hyperlipidemia Assessment: LDL-C above goal of less than 70 on ezetimibe 10 mg daily Intolerant to simvastatin and atorvastatin pravastatin Patient is active and hikes about 20 miles per week We talked about medication options including rosuvastatin 5mg  a day versus 5 mg 3 times a week, Repatha and Nexlizet Patient has a fear of needles and does not want to do injections Concerned about cost of brand-name medicines Patient willing to start with rosuvastatin 5 mg daily and decrease if needed  Plan: Start rosuvastatin 5 mg daily Repeat labs in 3 months lipid panel and LFTs Patient to call if he has issues tolerating    Thank you,  Olene Floss, Pharm.D, BCACP, CPP [facility] [address], [place], [place]  [address]  Phone: [phone]; Fax: [phone]

## 2024-02-23 NOTE — Patient Instructions (Addendum)
 Please start taking rosuvastatin 5mg  daily  Please call me if you have any issues  Please go for lab work in 3 months - you can [facility] for these [facility]

## 2024-03-17 DIAGNOSIS — G4733 Obstructive sleep apnea (adult) (pediatric): Secondary | ICD-10-CM | POA: Diagnosis not present

## 2024-05-10 DIAGNOSIS — R051 Acute cough: Secondary | ICD-10-CM | POA: Diagnosis not present

## 2024-05-10 DIAGNOSIS — U071 COVID-19: Secondary | ICD-10-CM | POA: Diagnosis not present

## 2024-05-10 DIAGNOSIS — R519 Headache, unspecified: Secondary | ICD-10-CM | POA: Diagnosis not present

## 2024-05-31 DIAGNOSIS — Z8349 Family history of other endocrine, nutritional and metabolic diseases: Secondary | ICD-10-CM | POA: Diagnosis not present

## 2024-05-31 DIAGNOSIS — M170 Bilateral primary osteoarthritis of knee: Secondary | ICD-10-CM | POA: Diagnosis not present

## 2024-05-31 DIAGNOSIS — Z9884 Bariatric surgery status: Secondary | ICD-10-CM | POA: Diagnosis not present

## 2024-05-31 DIAGNOSIS — R7401 Elevation of levels of liver transaminase levels: Secondary | ICD-10-CM | POA: Diagnosis not present

## 2024-05-31 DIAGNOSIS — E7800 Pure hypercholesterolemia, unspecified: Secondary | ICD-10-CM | POA: Diagnosis not present

## 2024-05-31 DIAGNOSIS — Z6838 Body mass index (BMI) 38.0-38.9, adult: Secondary | ICD-10-CM | POA: Diagnosis not present

## 2024-05-31 DIAGNOSIS — Z0000 Encounter for general adult medical examination without abnormal findings: Secondary | ICD-10-CM | POA: Diagnosis not present

## 2024-05-31 DIAGNOSIS — Z125 Encounter for screening for malignant neoplasm of prostate: Secondary | ICD-10-CM | POA: Diagnosis not present

## 2024-05-31 DIAGNOSIS — Z818 Family history of other mental and behavioral disorders: Secondary | ICD-10-CM | POA: Diagnosis not present

## 2024-06-02 ENCOUNTER — Encounter: Payer: Self-pay | Admitting: Pharmacist

## 2024-06-17 DIAGNOSIS — G4733 Obstructive sleep apnea (adult) (pediatric): Secondary | ICD-10-CM | POA: Diagnosis not present

## 2024-06-19 DIAGNOSIS — E785 Hyperlipidemia, unspecified: Secondary | ICD-10-CM | POA: Diagnosis not present

## 2024-06-20 ENCOUNTER — Ambulatory Visit: Payer: Self-pay | Admitting: Pharmacist

## 2024-06-20 LAB — HEPATIC FUNCTION PANEL
ALT: 47 IU/L — ABNORMAL HIGH (ref 0–44)
AST: 46 IU/L — ABNORMAL HIGH (ref 0–40)
Albumin: 4.4 g/dL (ref 3.9–4.9)
Alkaline Phosphatase: 98 IU/L (ref 44–121)
Bilirubin Total: 1 mg/dL (ref 0.0–1.2)
Bilirubin, Direct: 0.36 mg/dL (ref 0.00–0.40)
Total Protein: 6.9 g/dL (ref 6.0–8.5)

## 2024-06-20 LAB — LIPID PANEL
Chol/HDL Ratio: 2.5 ratio (ref 0.0–5.0)
Cholesterol, Total: 166 mg/dL (ref 100–199)
HDL: 66 mg/dL (ref >39)
LDL Chol Calc (NIH): 83 mg/dL (ref 0–99)
Triglycerides: 91 mg/dL (ref 0–149)
VLDL Cholesterol Cal: 17 mg/dL (ref 5–40)

## 2024-06-22 DIAGNOSIS — M5412 Radiculopathy, cervical region: Secondary | ICD-10-CM | POA: Diagnosis not present

## 2024-06-22 DIAGNOSIS — M47816 Spondylosis without myelopathy or radiculopathy, lumbar region: Secondary | ICD-10-CM | POA: Diagnosis not present

## 2024-06-22 DIAGNOSIS — M961 Postlaminectomy syndrome, not elsewhere classified: Secondary | ICD-10-CM | POA: Diagnosis not present

## 2024-06-26 MED ORDER — ROSUVASTATIN CALCIUM 5 MG PO TABS: 5.0000 mg | ORAL_TABLET | Freq: Every day | ORAL | 3 refills | Status: AC

## 2024-06-26 MED ORDER — EZETIMIBE 10 MG PO TABS: 10.0000 mg | ORAL_TABLET | Freq: Every day | ORAL | 3 refills | Status: AC

## 2024-07-24 DIAGNOSIS — L237 Allergic contact dermatitis due to plants, except food: Secondary | ICD-10-CM | POA: Diagnosis not present

## 2024-10-04 DIAGNOSIS — Z8349 Family history of other endocrine, nutritional and metabolic diseases: Secondary | ICD-10-CM | POA: Diagnosis not present

## 2024-10-04 DIAGNOSIS — Z125 Encounter for screening for malignant neoplasm of prostate: Secondary | ICD-10-CM | POA: Diagnosis not present

## 2024-10-04 DIAGNOSIS — R7401 Elevation of levels of liver transaminase levels: Secondary | ICD-10-CM | POA: Diagnosis not present

## 2024-10-04 DIAGNOSIS — Z818 Family history of other mental and behavioral disorders: Secondary | ICD-10-CM | POA: Diagnosis not present

## 2024-10-04 DIAGNOSIS — E7800 Pure hypercholesterolemia, unspecified: Secondary | ICD-10-CM | POA: Diagnosis not present

## 2024-10-11 DIAGNOSIS — Z0000 Encounter for general adult medical examination without abnormal findings: Secondary | ICD-10-CM | POA: Diagnosis not present

## 2024-10-11 DIAGNOSIS — E038 Other specified hypothyroidism: Secondary | ICD-10-CM | POA: Diagnosis not present

## 2024-10-11 DIAGNOSIS — Z9884 Bariatric surgery status: Secondary | ICD-10-CM | POA: Diagnosis not present

## 2024-10-11 DIAGNOSIS — E7800 Pure hypercholesterolemia, unspecified: Secondary | ICD-10-CM | POA: Diagnosis not present

## 2024-10-19 DIAGNOSIS — Z79891 Long term (current) use of opiate analgesic: Secondary | ICD-10-CM | POA: Diagnosis not present
